# Patient Record
Sex: Female | Born: 1975 | Race: White | Hispanic: No | Marital: Married | Smoking: Never smoker
Health system: Southern US, Community
[De-identification: ages and names within clinical notes are randomized; demographics above are authoritative.]

## PROBLEM LIST (undated history)

## (undated) DIAGNOSIS — R1011 Right upper quadrant pain: Secondary | ICD-10-CM

## (undated) HISTORY — PX: OTHER SURGICAL HISTORY: SHX169

## (undated) HISTORY — PX: CHOLECYSTECTOMY: SHX55

## (undated) HISTORY — DX: Right upper quadrant pain: R10.11

---

## 1999-12-17 ENCOUNTER — Other Ambulatory Visit: Admission: RE | Admit: 1999-12-17 | Discharge: 1999-12-17 | Payer: Self-pay | Admitting: Obstetrics and Gynecology

## 2000-08-05 ENCOUNTER — Inpatient Hospital Stay (HOSPITAL_COMMUNITY): Admission: AD | Admit: 2000-08-05 | Discharge: 2000-08-08 | Payer: Self-pay | Admitting: Obstetrics and Gynecology

## 2001-04-19 ENCOUNTER — Other Ambulatory Visit: Admission: RE | Admit: 2001-04-19 | Discharge: 2001-04-19 | Payer: Self-pay | Admitting: Obstetrics and Gynecology

## 2002-04-11 ENCOUNTER — Other Ambulatory Visit: Admission: RE | Admit: 2002-04-11 | Discharge: 2002-04-11 | Payer: Self-pay | Admitting: Gynecology

## 2002-04-11 ENCOUNTER — Other Ambulatory Visit: Admission: RE | Admit: 2002-04-11 | Discharge: 2002-04-11 | Payer: Self-pay | Admitting: Obstetrics and Gynecology

## 2002-12-06 ENCOUNTER — Inpatient Hospital Stay (HOSPITAL_COMMUNITY): Admission: RE | Admit: 2002-12-06 | Discharge: 2002-12-08 | Payer: Self-pay | Admitting: Obstetrics and Gynecology

## 2003-01-21 ENCOUNTER — Other Ambulatory Visit: Admission: RE | Admit: 2003-01-21 | Discharge: 2003-01-21 | Payer: Self-pay | Admitting: Obstetrics and Gynecology

## 2004-07-15 ENCOUNTER — Other Ambulatory Visit: Admission: RE | Admit: 2004-07-15 | Discharge: 2004-07-15 | Payer: Self-pay | Admitting: Obstetrics and Gynecology

## 2006-03-25 ENCOUNTER — Emergency Department (HOSPITAL_COMMUNITY): Admission: EM | Admit: 2006-03-25 | Discharge: 2006-03-26 | Payer: Self-pay | Admitting: Emergency Medicine

## 2010-01-03 ENCOUNTER — Emergency Department (HOSPITAL_COMMUNITY)
Admission: EM | Admit: 2010-01-03 | Discharge: 2010-01-03 | Payer: Self-pay | Source: Home / Self Care | Admitting: Emergency Medicine

## 2010-04-12 LAB — URINALYSIS, ROUTINE W REFLEX MICROSCOPIC
Glucose, UA: NEGATIVE mg/dL
Hgb urine dipstick: NEGATIVE
Ketones, ur: NEGATIVE mg/dL
pH: 6 (ref 5.0–8.0)

## 2010-04-12 LAB — COMPREHENSIVE METABOLIC PANEL
ALT: 20 U/L (ref 0–35)
AST: 20 U/L (ref 0–37)
Albumin: 3.6 g/dL (ref 3.5–5.2)
CO2: 20 mEq/L (ref 19–32)
Chloride: 98 mEq/L (ref 96–112)
Creatinine, Ser: 0.89 mg/dL (ref 0.4–1.2)
GFR calc Af Amer: >60 mL/min (ref >60)
Potassium: 3.3 mEq/L — ABNORMAL LOW (ref 3.5–5.1)
Sodium: 132 mEq/L — ABNORMAL LOW (ref 135–145)
Total Bilirubin: 0.3 mg/dL (ref 0.3–1.2)

## 2010-04-12 LAB — POCT CARDIAC MARKERS
Myoglobin, poc: 141 ng/mL (ref 12–200)
Troponin i, poc: <0.05 ng/mL (ref 0.00–0.09)

## 2010-04-12 LAB — CBC
Hemoglobin: 14.6 g/dL (ref 12.0–15.0)
MCH: 30.5 pg (ref 26.0–34.0)
Platelets: 232 10*3/uL (ref 150–400)
RBC: 4.79 MIL/uL (ref 3.87–5.11)
WBC: 10.3 10*3/uL (ref 4.0–10.5)

## 2010-04-12 LAB — URINE MICROSCOPIC-ADD ON

## 2010-04-12 LAB — URINE CULTURE

## 2010-06-18 NOTE — Op Note (Signed)
Jenny Daniel, Jenny Daniel                       ACCOUNT NO.:  [id number]   MEDICAL RECORD NO.:  [id number]                   PATIENT TYPE:  INP   LOCATION:  9121                                 FACILITY:  [facility]   PHYSICIAN:  Miguel Aschoff, M.D.                    DATE OF BIRTH:  [birth year]   DATE OF PROCEDURE:  12/06/2002  DATE OF DISCHARGE:                                 OPERATIVE REPORT   PREOPERATIVE DIAGNOSIS:  Intrauterine pregnancy at 38 weeks, for elective  repeat cesarean section.   POSTOPERATIVE DIAGNOSIS:  Intrauterine pregnancy at 38 weeks, for elective  repeat cesarean section, with delivery of a viable female infant, Apgar 9 and  9.   PROCEDURE:  Repeat low flap transverse cesarean section.   SURGEON:  Miguel Aschoff, M.D.   ASSISTANT:  Carrington Clamp, M.D.   ANESTHESIA:  Spinal.   COMPLICATIONS:  None.   JUSTIFICATION:  The patient is a 35 year old white female, gravida 2, para 1-  0-0-1, status post prior cesarean section in 2002.  The patient has had an  essentially uncomplicated antepartum course and is requesting that an  elective repeat cesarean section be performed at term.  She has declined  VBAC.  She has given her informed consent and is now being taken to the  operating room for repeat cesarean section.   DESCRIPTION OF PROCEDURE:  The patient was taken to the operating room and  placed in the sitting position, and spinal anesthesia was administered  without difficulty.  She was then placed in the supine position deviated to  the left and prepped and draped in the usual sterile fashion.  A Foley  catheter was inserted.  Following this a Pfannenstiel incision was made  extending down to the subcutaneous tissue with bleeding points being clamped  and coagulated as they were encountered.  The fascia was then identified and  incised transversely.  The rectus muscles were then divided in the midline,  the peritoneum was then found and entered, carefully avoiding  underlying  structures.  The peritoneal incision was then extended under direct  visualization.  A bladder flap was then created and protected with the  bladder blade, then an elliptical transverse incision was made into the low  uterine segment.  The amniotic cavity was entered.  Light meconium-stained  fluid was obtained.  The patient was then delivered of a viable female infant,  Apgar 9 at one minute and 9 at five minutes, from a vertex LOA position.  Nose and mouth were suctioned before the baby was handed to the pediatric  team in attendance.  Cord bloods were obtained for appropriate studies.  The  placenta was then delivered, the uterine cavity evacuated of any remaining  products of conception.  At this point the angles of the uterine incision  were identified and ligated using figure-of-eight sutures of #1 Vicryl.  Then the uterus was closed  in layers.  The first layer was a running  interlocking suture of #1 Vicryl, followed by an imbricating suture of #1  Vicryl.  The suture line was inspected.  There were two areas of oozing  noted, and these were promptly brought under control by figure-of-eight  sutures of 0 Vicryl suture.  The bladder flap was then reapproximated using  running continuous 2-0 Vicryl suture.  At this point lap counts and  instrument counts were taken and found to be correct.  Tubes and ovaries  were inspected to be normal, then the abdomen was closed.  The parietal  peritoneum was closed using running continuous 0 Vicryl, the rectus muscles  were reapproximated using running continuous 0 Vicryl suture, and the fascia  was closed using two sutures of 0 Vicryl, each starting at  the lateral fascial angles and meeting in the midline.  The subcutaneous  tissue and skin were closed using staples.  The estimated blood loss was  approximately 600 mL.  The patient tolerated the procedure well and went to  the recovery room in satisfactory condition.                                                Miguel Aschoff, M.D.    AR/MEDQ  D:  12/06/2002  T:  12/06/2002  Job:  [id number]

## 2010-06-18 NOTE — Discharge Summary (Signed)
Jenny Daniel, Jenny Daniel                       ACCOUNT NO.:  [id number]   MEDICAL RECORD NO.:  [id number]                   PATIENT TYPE:  INP   LOCATION:  9121                                 FACILITY:  [facility]   PHYSICIAN:  Randye Lobo, M.D.                DATE OF BIRTH:  01-11-1976   DATE OF ADMISSION:  12/06/2002  DATE OF DISCHARGE:  12/08/2002                                 DISCHARGE SUMMARY   FINAL DIAGNOSES:  1. Intrauterine pregnancy at [redacted] weeks gestation.  2. Desires repeat cesarean section.  3. Declines vaginal birth after cesarean.   PROCEDURE:  Repeat low flap transverse cesarean section.   SURGEON:  Miguel Aschoff, M.D.   ASSISTANT:  Carrington Clamp, M.D.   COMPLICATIONS:  None.   HISTORY:  This 35 year old G2, P1-0-0-1 had a previous cesarean section in  2002 and desires a repeat cesarean section with this pregnancy and declines  VBAC.  The patient's antepartum course had been uncomplicated.  She is taken  to the operating room on December 06, 2002 by Miguel Aschoff, M.D. where a repeat  low flap transverse cesarean section was performed with the delivery of a 7  pound 14 ounce female infant with Apgars of 9 and 9.  The delivery went  without complications.  The patient's postoperative course was benign  without significant fevers.  The little boy was circumcised before  discharge.  The patient was felt ready for discharge on postoperative day  #2.  Was sent home on a regular diet.  Told to decrease activities.  Told to  continue prenatal vitamins and FeSo4 325 mg one b.i.d.  Was given Percocet  one to two q.4h. as needed for pain.  Told she could use over-the-counter  ibuprofen as needed and was to follow up in the office in two days for  staple removal.   DISCHARGE LABORATORIES:  The patient had a hemoglobin of 10.3 and a white  blood cell count of 11.2.     Leilani Able, P.A.-C.                Randye Lobo, M.D.    MB/MEDQ  D:  01/06/2003  T:  01/06/2003   Job:  [id number]

## 2010-06-18 NOTE — Discharge Summary (Signed)
[facility]  Patient:    Jenny Daniel, Jenny Daniel                    MRN: [id number] Adm. Date:  [id number] Disc. Date: [id number] Attending:  Miguel Aschoff Dictator:   Leilani Able, P.A.                           Discharge Summary  FINAL DIAGNOSES:              Intrauterine pregnancy at term, arrest of second stage of labor with failure of descent.  PROCEDURE:                    Primary low flap transverse cesarean section.  SURGEON:                      Miguel Aschoff, M.D.  COMPLICATIONS:                None.  HISTORY:                      This 35 year old G1, P0 presents at term in active labor.  Patients cervix is about 5 cm dilated.  She progressed to complete complete.  Had an epidural placed.  Patient was allowed to have a second stage of labor lasting three hours and in spite of vigorous pushing she was unable to generate any further descent of the fetal presenting part further than a +1 station.  In view of the arrest of descent patient was taken to the operating room to undergo a primary cesarean section.  Patient is taken to the operating room by Dr. Miguel Aschoff where a primary low flap transverse cesarean section was performed with the delivery of an 8 pound 9 ounce female infant with Apgars of 8 and 9.  Delivery went without complications. Patients postoperative course was benign without significant fevers.  She was felt ready for discharge on postoperative day #2.  Was sent home on a regular diet.  Told to decrease activities.  Was given prescriptions for Motrin and Tylox for pain and told to follow-up in the office in four weeks. DD:  08/24/00 TD:  08/24/00 Job: [id number]

## 2010-06-18 NOTE — Op Note (Signed)
[facility]  Patient:    Jenny Daniel, Jenny Daniel Visit Number: [id number] MRN: [id number]          Service Type: Attending:  Miguel Aschoff, M.D. Proc. Date: 08/06/00                             Operative Report  PREOPERATIVE DIAGNOSIS:       Intrauterine pregnancy at term with arrest of the second stage of labor with failure of descent.  POSTOPERATIVE DIAGNOSES:      1. Intrauterine pregnancy at term with arrest of                                  the second stage of labor with failure of                                  descent.                               2. Delivery of viable female infant, Apgars                                  8 and 9 weighing 8 lb 9 oz.  PROCEDURE:                    Primary low flap transverse cesarean section.  SURGEON:                      Miguel Aschoff, M.D.  ANESTHESIA:                   Epidural.  COMPLICATIONS:                None.  JUSTIFICATION:                The patient is a 35 year old white female, gravida 1, para 0 with an estimated date of confinement of August 06, 2000.  The patient was admitted 5 cm dilatation and progressed to complete/complete at 9:20 a.m.  The patient has had an epidural anesthetic and was allowed to have a second stage of labor lasting three hours.  In spite of vigorous pushing efforts, she has been unable to generate any further descent of the fetal presenting part further than to +1 station.  Estimated fetal weight is approximately 8  to 8-1/2 lb.  In view of the arrest of descent, she is being taken now to undergo primary cesarean section.  DESCRIPTION OF PROCEDURE:     The patient was taken to the operating room and placed in the supine position.  A satisfactory level of epidural anesthesia was achieved without difficulty.  The patient had a previously-placed Foley catheter.  She was prepped and draped in the usual sterile fashion.  A Pfannenstiel incision was then made and extended down  through the subcutaneous tissue, with bleeding points being clamped and coagulated as they were encountered.  The fascia was then identified and incised transversely and separated from the underlying rectus muscles.  The rectus muscles were divided in the midline.  The peritoneum was then identified and entered carefully, avoiding underlying structures.  The peritoneal incision was  then extended under direct visualization.  A bladder flap was then created and protected with a bladder blade.  An elliptical transverse incision was made in the lower uterine segment.  The amniotic cavity was entered.  Light meconium-stained fluid was noted.  The baby was delivered from a vertex ROA position.  The nose and mouth were suctioned with DeLee suction.  The cord was doubly clamped and cut and the baby was handed  to the pediatric team in attendance.  The Apgar score at one minute was 8 and, at five minutes, was 9.  The babys weight was 8 lb 9 oz.  Cord bloods were then obtained for appropriate studies.  The placenta was delivered.  The uterus was evacuated of any remaining products of conception.  The angles of the uterine incision were then ligated using figure-of-eight sutures of #1 Vicryl.  The uterus was then closed in layers. The first layer was a running interlocking suture of #1 Vicryl followed by an imbricating suture of #1 Vicryl.  The bladder flap was reapproximated using running continuous 2-0 Vicryl suture.  The tubes and ovaries were inspected and were noted to be within normal limits.  The abdomen was irrigated with warm saline.  Lap counts were taken and found to be correct.  The abdomen was then closed.  The parietal peritoneum was closed using running continuous 0 Vicryl suture.  The fascia was reapproximated using two sutures of 0 Vicryl, each starting at the lateral fascial angles and meeting in the midline.  The subcutaneous tissue and skin were closed using staples.  Estimated  blood loss was approximately 800 cc.  The patient tolerated the procedure well and went to the recovery room in satisfactory condition. Attending:  Miguel Aschoff, M.D. DD:  08/06/00 TD:  08/06/00 Job: [id number]

## 2019-03-19 ENCOUNTER — Other Ambulatory Visit: Payer: Self-pay | Admitting: Obstetrics

## 2019-03-19 DIAGNOSIS — R928 Other abnormal and inconclusive findings on diagnostic imaging of breast: Secondary | ICD-10-CM

## 2019-03-22 ENCOUNTER — Other Ambulatory Visit: Payer: Self-pay

## 2019-03-22 ENCOUNTER — Other Ambulatory Visit: Payer: Self-pay | Admitting: Obstetrics

## 2019-03-22 ENCOUNTER — Ambulatory Visit
Admission: RE | Admit: 2019-03-22 | Discharge: 2019-03-22 | Disposition: A | Discharge status: Home / Self Care | Payer: BC Managed Care – PPO | Source: Ambulatory Visit | Attending: Obstetrics | Admitting: Obstetrics

## 2019-03-22 DIAGNOSIS — R928 Other abnormal and inconclusive findings on diagnostic imaging of breast: Secondary | ICD-10-CM

## 2019-03-22 DIAGNOSIS — N6320 Unspecified lump in the left breast, unspecified quadrant: Secondary | ICD-10-CM

## 2019-08-12 ENCOUNTER — Encounter: Payer: Self-pay | Admitting: Physician Assistant

## 2019-08-12 ENCOUNTER — Other Ambulatory Visit: Payer: Self-pay

## 2019-08-12 ENCOUNTER — Ambulatory Visit: Payer: BC Managed Care – PPO | Admitting: Physician Assistant

## 2019-08-12 VITALS — BP 110/68 | HR 88 | Temp 97.9°F | Ht 61.0 in | Wt 171.0 lb

## 2019-08-12 DIAGNOSIS — F419 Anxiety disorder, unspecified: Secondary | ICD-10-CM | POA: Insufficient documentation

## 2019-08-12 DIAGNOSIS — R1011 Right upper quadrant pain: Secondary | ICD-10-CM | POA: Diagnosis not present

## 2019-08-12 MED ORDER — PANTOPRAZOLE SODIUM 40 MG PO TBEC: 40.0000 mg | DELAYED_RELEASE_TABLET | Freq: Every day | ORAL | 1 refills | Status: DC

## 2019-08-12 MED ORDER — LORAZEPAM 0.5 MG PO TABS: 0.5000 mg | ORAL_TABLET | Freq: Every day | ORAL | 0 refills | Status: DC | PRN

## 2019-08-12 NOTE — Assessment & Plan Note (Signed)
rx for ativan 

## 2019-08-12 NOTE — Progress Notes (Signed)
Established Patient Office Visit  Subjective:  Patient ID: Jenny Daniel, female    DOB: [birth year]  Age: 44 y.o. MRN: [id number]  CC:  Chief Complaint  Patient presents with  . Abdominal Pain    Right upper quadrant    HPI Jenny Daniel presents for RUQ pain Pt has had issues intermittently for several months with ruq pain, indigestion and nausea - she has not had vomiting Had a 'spell' last week - episodes happen intermittently Had been advised in the past to have gallbladder evaluated but she has put this off  Pt states that she has been having some intermittent anxiety attacks - has been a problem off and on for awhile - has never been on medication States she gets nervous and anxious - feels like she can't get good deep breath  Past Medical History:  Diagnosis Date  . Right upper quadrant pain     Past Surgical History:  Procedure Laterality Date  . CESAREAN SECTION     x2  . pilonidal cyst removal      Family History  Problem Relation Age of Onset  . Diabetes Mellitus II Other   . Hyperlipidemia Other   . Hypertension Other     Social History   Socioeconomic History  . Marital status: Married    Spouse name: Not on file  . Number of children: 2  . Years of education: Not on file  . Highest education level: Not on file  Occupational History  . 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 Smoking status: Never Smoker  . Smokeless tobacco: Never Used  Vaping Use  . Vaping Use: Never used  Substance and Sexual Activity  . Alcohol use: Not Currently  . Drug use: Never  . Sexual activity: Not on file  Other Topics Concern  . Not on file  Social History Narrative  . Not on file   Social Determinants of Health   Financial Resource Strain:   . Difficulty of Paying Living Expenses:   Food Insecurity:   . Worried About Programme researcher, broadcasting/film/video in the Last Year:   . Barista in the Last Year:   Transportation Needs:   . Automotive engineer (Medical):   Marland Kitchen Lack of Transportation (Non-Medical):   Physical Activity:   . Days of Exercise per Week:   . Minutes of Exercise per Session:   Stress:   . Feeling of Stress :   Social Connections:   . Frequency of Communication with Friends and Family:   . Frequency of Social Gatherings with Friends and Family:   . Attends Religious Services:   . Active Member of Clubs or Organizations:   . Attends Banker Meetings:   Marland Kitchen Marital Status:   Intimate Partner Violence:   . Fear of Current or Ex-Partner:   . Emotionally Abused:   Marland Kitchen Physically Abused:   . Sexually Abused:      Current Outpatient Medications:  .  LORazepam (ATIVAN) 0.5 MG tablet, Take 1 tablet (0.5 mg total) by mouth daily as needed for anxiety., Disp: 30 tablet, Rfl: 0 .  pantoprazole (PROTONIX) 40 MG tablet, Take 1 tablet (40 mg total) by mouth daily., Disp: 30 tablet, Rfl: 1   No Known Allergies  ROS CONSTITUTIONAL: Negative for chills, fatigue, fever, unintentional weight gain and unintentional weight loss.   CARDIOVASCULAR: Negative for chest pain, dizziness, palpitations and pedal edema.  RESPIRATORY: Negative for recent cough and dyspnea.  GASTROINTESTINAL:see HPI  PSYCHIATRIC: see HPI       Objective:    PHYSICAL EXAM:   VS: BP 110/68 (BP Location: Left Arm, Patient Position: Sitting)   Pulse 88   Temp 97.9 F (36.6 C) (Temporal)   Ht 5\' 1"  (1.549 m)   Wt 171 lb (77.6 kg)   SpO2 100%   BMI 32.31 kg/m   GEN: Well nourished, well developed, in no acute distress  Cardiac: RRR; no murmurs, rubs, or gallops,no edema - no significant varicosities Respiratory:  normal respiratory rate and pattern with no distress - normal breath sounds with no rales, rhonchi, wheezes or rubs  Psych: euthymic mood, appropriate affect and demeanor  BP 110/68 (BP Location: Left Arm, Patient Position: Sitting)   Pulse 88   Temp 97.9 F (36.6 C) (Temporal)   Ht 5\' 1"  (1.549 m)   Wt 171  lb (77.6 kg)   SpO2 100%   BMI 32.31 kg/m  Wt Readings from Last 3 Encounters:  08/12/19 171 lb (77.6 kg)     Health Maintenance Due  Topic Date Due  . Hepatitis C Screening  Never done  . COVID-19 Vaccine (1) Never done  . HIV Screening  Never done  . PAP SMEAR-Modifier  Never done    There are no preventive care reminders to display for this patient.  No results found for: TSH Lab Results  Component Value Date   WBC 10.3 01/03/2010   HGB 14.6 01/03/2010   HCT 42.7 01/03/2010   MCV 89.1 01/03/2010   PLT 232 01/03/2010   Lab Results  Component Value Date   NA 132 (L) 01/03/2010   K 3.3 (L) 01/03/2010   CO2 20 01/03/2010   GLUCOSE 83 01/03/2010   BUN 9 01/03/2010   CREATININE 0.89 01/03/2010   BILITOT 0.3 01/03/2010   ALKPHOS 47 01/03/2010   AST 20 01/03/2010   ALT 20 01/03/2010   PROT 7.7 01/03/2010   ALBUMIN 3.6 01/03/2010   CALCIUM 9.0 01/03/2010   No results found for: CHOL No results found for: HDL No results found for: LDLCALC No results found for: TRIG No results found for: CHOLHDL No results found for: 14/05/2009    Assessment & Plan:   Problem List Items Addressed This Visit      Other   Right upper quadrant abdominal pain - Primary    Gallbladder diet rx for protonix labwork pending Will schedule gallbladder ultrasound      Relevant Orders   Comprehensive metabolic panel   Helicobacter pylori abs-IgG+IgA, bld   14/05/2009 Abdomen Limited RUQ   Anxiety    rx for ativan      Relevant Medications   LORazepam (ATIVAN) 0.5 MG tablet      Meds ordered this encounter  Medications  . pantoprazole (PROTONIX) 40 MG tablet    Sig: Take 1 tablet (40 mg total) by mouth daily.    Dispense:  30 tablet    Refill:  1    Order Specific Question:   Supervising Provider    [email or web address] [place]  . LORazepam (ATIVAN) 0.5 MG tablet    Sig: Take 1 tablet (0.5 mg total) by mouth daily as needed for anxiety.    Dispense:  30 tablet    Refill:  0     Order Specific Question:   Supervising Provider    Answer:   Blane Ohara    Follow-up: Return if symptoms worsen or fail to improve.  SARA R Kolsen Choe, PA-C

## 2019-08-12 NOTE — Patient Instructions (Signed)
Gallbladder Eating Plan If you have a gallbladder condition, you may have trouble digesting fats. Eating a low-fat diet can help reduce your symptoms, and may be helpful before and after having surgery to remove your gallbladder (cholecystectomy). Your health care provider may recommend that you work with a diet and nutrition specialist (dietitian) to help you reduce the amount of fat in your diet. What are tips for following this plan? General guidelines  Limit your fat intake to less than 30% of your total daily calories. If you eat around 1,800 calories each day, this is less than 60 grams (g) of fat per day.  Fat is an important part of a healthy diet. Eating a low-fat diet can make it hard to maintain a healthy body weight. Ask your dietitian how much fat, calories, and other nutrients you need each day.  Eat small, frequent meals throughout the day instead of three large meals.  Drink at least 8-10 cups of fluid a day. Drink enough fluid to keep your urine clear or pale yellow.  Limit alcohol intake to no more than 1 drink a day for nonpregnant women and 2 drinks a day for men. One drink equals 12 oz of beer, 5 oz of wine, or 1 oz of hard liquor. Reading food labels  Check Nutrition Facts on food labels for the amount of fat per serving. Choose foods with less than 3 grams of fat per serving. Shopping  Choose nonfat and low-fat healthy foods. Look for the words "nonfat," "low fat," or "fat free."  Avoid buying processed or prepackaged foods. Cooking  Cook using low-fat methods, such as baking, broiling, grilling, or boiling.  Cook with small amounts of healthy fats, such as olive oil, grapeseed oil, canola oil, or sunflower oil. What foods are recommended?   All fresh, frozen, or canned fruits and vegetables.  Whole grains.  Low-fat or non-fat (skim) milk and yogurt.  Lean meat, skinless poultry, fish, eggs, and beans.  Low-fat protein supplement powders or  drinks.  Spices and herbs. What foods are not recommended?  High-fat foods. These include baked goods, fast food, fatty cuts of meat, ice cream, french toast, sweet rolls, pizza, cheese bread, foods covered with butter, creamy sauces, or cheese.  Fried foods. These include french fries, tempura, battered fish, breaded chicken, fried breads, and sweets.  Foods with strong odors.  Foods that cause bloating and gas. Summary  A low-fat diet can be helpful if you have a gallbladder condition, or before and after gallbladder surgery.  Limit your fat intake to less than 30% of your total daily calories. This is about 60 g of fat if you eat 1,800 calories each day.  Eat small, frequent meals throughout the day instead of three large meals. This information is not intended to replace advice given to you by your health care provider. Make sure you discuss any questions you have with your health care provider. Document Revised: 05/10/2018 Document Reviewed: 02/25/2016 Elsevier Patient Education  2020 [employer] Inc.  

## 2019-08-12 NOTE — Assessment & Plan Note (Signed)
Gallbladder diet rx for protonix labwork pending Will schedule gallbladder ultrasound

## 2019-08-13 LAB — COMPREHENSIVE METABOLIC PANEL
ALT: 20 IU/L (ref 0–32)
AST: 20 IU/L (ref 0–40)
Albumin/Globulin Ratio: 1.5 (ref 1.2–2.2)
Albumin: 4.6 g/dL (ref 3.8–4.8)
Alkaline Phosphatase: 61 IU/L (ref 48–121)
BUN/Creatinine Ratio: 15 (ref 9–23)
BUN: 12 mg/dL (ref 6–24)
Bilirubin Total: 0.3 mg/dL (ref 0.0–1.2)
CO2: 21 mmol/L (ref 20–29)
Calcium: 9.4 mg/dL (ref 8.7–10.2)
Chloride: 107 mmol/L — ABNORMAL HIGH (ref 96–106)
Creatinine, Ser: 0.8 mg/dL (ref 0.57–1.00)
GFR calc Af Amer: 104 mL/min/{1.73_m2} (ref >59)
GFR calc non Af Amer: 91 mL/min/{1.73_m2} (ref >59)
Globulin, Total: 3 g/dL (ref 1.5–4.5)
Glucose: 88 mg/dL (ref 65–99)
Potassium: 4.2 mmol/L (ref 3.5–5.2)
Sodium: 143 mmol/L (ref 134–144)
Total Protein: 7.6 g/dL (ref 6.0–8.5)

## 2019-08-13 LAB — HELICOBACTER PYLORI ABS-IGG+IGA, BLD
H. pylori, IgA Abs: 16.3 units — ABNORMAL HIGH (ref 0.0–8.9)
H. pylori, IgG AbS: 0.21 Index Value (ref 0.00–0.79)

## 2019-08-14 ENCOUNTER — Other Ambulatory Visit: Payer: Self-pay | Admitting: Physician Assistant

## 2019-08-14 MED ORDER — CLARITHROMYCIN 500 MG PO TABS: 500.0000 mg | ORAL_TABLET | Freq: Two times a day (BID) | ORAL | 0 refills | Status: DC

## 2019-08-14 MED ORDER — AMOXICILLIN 500 MG PO CAPS: 1000.0000 mg | ORAL_CAPSULE | Freq: Two times a day (BID) | ORAL | 0 refills | Status: DC

## 2019-08-27 ENCOUNTER — Encounter: Payer: Self-pay | Admitting: Physician Assistant

## 2019-08-28 ENCOUNTER — Other Ambulatory Visit: Payer: Self-pay | Admitting: Physician Assistant

## 2019-08-28 DIAGNOSIS — K769 Liver disease, unspecified: Secondary | ICD-10-CM

## 2019-09-03 ENCOUNTER — Other Ambulatory Visit: Payer: Self-pay | Admitting: Physician Assistant

## 2019-09-05 ENCOUNTER — Other Ambulatory Visit: Payer: Self-pay

## 2019-09-05 ENCOUNTER — Ambulatory Visit: Payer: BC Managed Care – PPO | Admitting: Physician Assistant

## 2019-09-05 ENCOUNTER — Encounter: Payer: Self-pay | Admitting: Physician Assistant

## 2019-09-05 VITALS — BP 94/58 | HR 84 | Temp 97.2°F | Ht 61.0 in | Wt 168.0 lb

## 2019-09-05 DIAGNOSIS — R1084 Generalized abdominal pain: Secondary | ICD-10-CM | POA: Diagnosis not present

## 2019-09-05 DIAGNOSIS — R5383 Other fatigue: Secondary | ICD-10-CM | POA: Diagnosis not present

## 2019-09-05 NOTE — Assessment & Plan Note (Signed)
labwork pending 

## 2019-09-05 NOTE — Assessment & Plan Note (Signed)
Pt requested gluten allergy testing Continue protonix as directed Pt being scheduled for MRI liver (abnormal ultrasound) Follow up in 2 months

## 2019-09-05 NOTE — Progress Notes (Signed)
Established Patient Office Visit  Subjective:  Patient ID: Jenny Daniel, female    DOB: 1975/05/08  Age: 44 y.o. MRN: [id number]  CC:  Chief Complaint  Patient presents with  . Follow up hpylori    HPI Jenny Daniel presents for follow up of hpylori Pt states she is feeling overall better after completing antibiotic therapy - still having some slight burning in stomach - she denies nausea/vomiting Is still taking the protonix as directed However she states she is feeling fatigued She also requested to have labs drawn for gluten intolerance  Pt states that she has been having some intermittent anxiety attacks - she has taken a few of the ativan which do help  Past Medical History:  Diagnosis Date  . Right upper quadrant pain     Past Surgical History:  Procedure Laterality Date  . CESAREAN SECTION     x2  . pilonidal cyst removal      Family History  Problem Relation Age of Onset  . Diabetes Mellitus II Other   . Hyperlipidemia Other   . Hypertension Other     Social History   Socioeconomic History  . Marital status: Married    Spouse name: Not on file  . Number of children: 2  . Years of education: Not on file  . Highest education level: Not on file  Occupational History  . 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 Smoking status: Never Smoker  . Smokeless tobacco: Never Used  Vaping Use  . Vaping Use: Never used  Substance and Sexual Activity  . Alcohol use: Not Currently  . Drug use: Never  . Sexual activity: Not on file  Other Topics Concern  . Not on file  Social History Narrative  . Not on file   Social Determinants of Health   Financial Resource Strain:   . Difficulty of Paying Living Expenses:   Food Insecurity:   . Worried About Programme researcher, broadcasting/film/video in the Last Year:   . Barista in the Last Year:   Transportation Needs:   . Freight forwarder (Medical):   Marland Kitchen Lack of Transportation (Non-Medical):   Physical  Activity:   . Days of Exercise per Week:   . Minutes of Exercise per Session:   Stress:   . Feeling of Stress :   Social Connections:   . Frequency of Communication with Friends and Family:   . Frequency of Social Gatherings with Friends and Family:   . Attends Religious Services:   . Active Member of Clubs or Organizations:   . Attends Banker Meetings:   Marland Kitchen Marital Status:   Intimate Partner Violence:   . Fear of Current or Ex-Partner:   . Emotionally Abused:   Marland Kitchen Physically Abused:   . Sexually Abused:      Current Outpatient Medications:  .  LORazepam (ATIVAN) 0.5 MG tablet, Take 1 tablet (0.5 mg total) by mouth daily as needed for anxiety., Disp: 30 tablet, Rfl: 0 .  pantoprazole (PROTONIX) 40 MG tablet, TAKE 1 TABLET BY MOUTH EVERY DAY, Disp: 30 tablet, Rfl: 1   No Known Allergies  ROS CONSTITUTIONAL: Negative for chills, fatigue, fever, unintentional weight gain and unintentional weight loss.   CARDIOVASCULAR: Negative for chest pain, dizziness, palpitations and pedal edema.  RESPIRATORY: Negative for recent cough and dyspnea.  GASTROINTESTINAL:see HPI  PSYCHIATRIC: see HPI       Objective:    PHYSICAL EXAM:   VS:  BP (!) 94/58 (BP Location: Left Arm, Patient Position: Sitting)   Pulse 84   Temp (!) 97.2 F (36.2 C) (Temporal)   Ht 5\' 1"  (1.549 m)   Wt 168 lb (76.2 kg)   SpO2 99%   BMI 31.74 kg/m   GEN: Well nourished, well developed, in no acute distress  Cardiac: RRR; no murmurs, rubs, or gallops,no edema - no significant varicosities Respiratory:  normal respiratory rate and pattern with no distress - normal breath sounds with no rales, rhonchi, wheezes or rubs  Psych: euthymic mood, appropriate affect and demeanor  BP (!) 94/58 (BP Location: Left Arm, Patient Position: Sitting)   Pulse 84   Temp (!) 97.2 F (36.2 C) (Temporal)   Ht 5\' 1"  (1.549 m)   Wt 168 lb (76.2 kg)   SpO2 99%   BMI 31.74 kg/m  Wt Readings from Last 3  Encounters:  09/05/19 168 lb (76.2 kg)  08/12/19 171 lb (77.6 kg)     Health Maintenance Due  Topic Date Due  . Hepatitis C Screening  Never done  . COVID-19 Vaccine (1) Never done  . HIV Screening  Never done  . PAP SMEAR-Modifier  Never done  . INFLUENZA VACCINE  09/01/2019    There are no preventive care reminders to display for this patient.  No results found for: TSH Lab Results  Component Value Date   WBC 10.3 01/03/2010   HGB 14.6 01/03/2010   HCT 42.7 01/03/2010   MCV 89.1 01/03/2010   PLT 232 01/03/2010   Lab Results  Component Value Date   NA 143 08/12/2019   K 4.2 08/12/2019   CO2 21 08/12/2019   GLUCOSE 88 08/12/2019   BUN 12 08/12/2019   CREATININE 0.80 08/12/2019   BILITOT 0.3 08/12/2019   ALKPHOS 61 08/12/2019   AST 20 08/12/2019   ALT 20 08/12/2019   PROT 7.6 08/12/2019   ALBUMIN 4.6 08/12/2019   CALCIUM 9.4 08/12/2019   No results found for: CHOL No results found for: HDL No results found for: LDLCALC No results found for: TRIG No results found for: CHOLHDL No results found for: 10/13/2019    Assessment & Plan:   Problem List Items Addressed This Visit      Other   Generalized abdominal pain - Primary    Pt requested gluten allergy testing Continue protonix as directed Pt being scheduled for MRI liver (abnormal ultrasound) Follow up in 2 months       Relevant Orders   Gluten Sensitivity Screen   Other fatigue    labwork pending      Relevant Orders   CBC with Differential/Platelet   Comprehensive metabolic panel   TSH      No orders of the defined types were placed in this encounter.   Follow-up: Return in about 2 months (around 11/05/2019) for follow up.    SARA R Samia Kukla, PA-C

## 2019-09-09 ENCOUNTER — Telehealth (INDEPENDENT_AMBULATORY_CARE_PROVIDER_SITE_OTHER): Payer: BC Managed Care – PPO | Admitting: Legal Medicine

## 2019-09-09 ENCOUNTER — Encounter: Payer: Self-pay | Admitting: Legal Medicine

## 2019-09-09 DIAGNOSIS — I2699 Other pulmonary embolism without acute cor pulmonale: Secondary | ICD-10-CM | POA: Diagnosis not present

## 2019-09-09 NOTE — Progress Notes (Signed)
Virtual Visit via Telephone Note   This visit type was conducted due to national recommendations for restrictions regarding the COVID-19 Pandemic (e.g. social distancing) in an effort to limit this patient's exposure and mitigate transmission in our community.  Due to her co-morbid illnesses, this patient is at least at moderate risk for complications without adequate follow up.  This format is felt to be most appropriate for this patient at this time.  The patient did not have access to video technology/had technical difficulties with video requiring transitioning to audio format only (telephone).  All issues noted in this document were discussed and addressed.  No physical exam could be performed with this format.  Patient verbally consented to a telehealth visit.   Date:  09/09/2019   ID:  Jenny Daniel, DOB [birth year], MRN [id number]  Patient Location: Home Provider Location: Office/Clinic  PCP:  Marianne Sofia, PA-C   Evaluation Performed:  New Patient Evaluation  Chief Complaint:  Cough, dyspnea  History of Present Illness:    Jenny Daniel is a 44 y.o. female with sudden onset cough and dyspnea.  Weak. Pulse 107.  She is concerned about PE because hypercoagulability runs in family.  No past work up.  No calf pain or swelling.  The patient does not have symptoms concerning for COVID-19 infection (fever, chills, cough, or new shortness of breath).    Past Medical History:  Diagnosis Date  . Right upper quadrant pain     Past Surgical History:  Procedure Laterality Date  . CESAREAN SECTION     x2  . pilonidal cyst removal      Family History  Problem Relation Age of Onset  . Diabetes Mellitus II Other   . Hyperlipidemia Other   . Hypertension Other     Social History   Socioeconomic History  . Marital status: Married    Spouse name: Not on file  . Number of children: 2  . Years of education: Not on file  . Highest education level: Not on file  Occupational  History  . 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 Smoking status: Never Smoker  . Smokeless tobacco: Never Used  Vaping Use  . Vaping Use: Never used  Substance and Sexual Activity  . Alcohol use: Not Currently  . Drug use: Never  . Sexual activity: Not on file  Other Topics Concern  . Not on file  Social History Narrative  . Not on file   Social Determinants of Health   Financial Resource Strain:   . Difficulty of Paying Living Expenses:   Food Insecurity:   . Worried About Programme researcher, broadcasting/film/video in the Last Year:   . Barista in the Last Year:   Transportation Needs:   . Freight forwarder (Medical):   Marland Kitchen Lack of Transportation (Non-Medical):   Physical Activity:   . Days of Exercise per Week:   . Minutes of Exercise per Session:   Stress:   . Feeling of Stress :   Social Connections:   . Frequency of Communication with Friends and Family:   . Frequency of Social Gatherings with Friends and Family:   . Attends Religious Services:   . Active Member of Clubs or Organizations:   . Attends Banker Meetings:   Marland Kitchen Marital Status:   Intimate Partner Violence:   . Fear of Current or Ex-Partner:   . Emotionally Abused:   Marland Kitchen Physically Abused:   . Sexually Abused:  Outpatient Medications Prior to Visit  Medication Sig Dispense Refill  . LORazepam (ATIVAN) 0.5 MG tablet Take 1 tablet (0.5 mg total) by mouth daily as needed for anxiety. 30 tablet 0  . pantoprazole (PROTONIX) 40 MG tablet TAKE 1 TABLET BY MOUTH EVERY DAY 30 tablet 1   No facility-administered medications prior to visit.   .med Allergies:   Patient has no known allergies.   Social History   Tobacco Use  . Smoking status: Never Smoker  . Smokeless tobacco: Never Used  Vaping Use  . Vaping Use: Never used  Substance Use Topics  . Alcohol use: Not Currently  . Drug use: Never     Review of Systems  Constitutional: Negative.   Eyes: Negative.   Respiratory: Positive for  cough and shortness of breath.   Cardiovascular: Negative.   Gastrointestinal: Negative.   Genitourinary: Negative.   Musculoskeletal: Negative.   Skin: Negative.   Neurological: Negative.   Psychiatric/Behavioral: Negative.      Labs/Other Tests and Data Reviewed:    Recent Labs: 08/12/2019: ALT 20; BUN 12; Creatinine, Ser 0.80; Potassium 4.2; Sodium 143   Recent Lipid Panel No results found for: CHOL, TRIG, HDL, CHOLHDL, LDLCALC, LDLDIRECT  Wt Readings from Last 3 Encounters:  09/09/19 160 lb (72.6 kg)  09/05/19 168 lb (76.2 kg)  08/12/19 171 lb (77.6 kg)     Objective:    Vital Signs:  BP 110/70   Pulse (!) 107   Temp 98.2 F (36.8 C)   Ht 5\' 1"  (1.549 m)   Wt 160 lb (72.6 kg)   BMI 30.23 kg/m    Physical Exam Reviewed  ASSESSMENT & PLAN:    Diagnoses and all orders for this visit: Acute pulmonary embolism, unspecified pulmonary embolism type, unspecified whether acute cor pulmonale present (HCC) ct angiogram was negative for pulmonary embolus, patient informed No orders of the defined types were placed in this encounter.    No orders of the defined types were placed in this encounter.   COVID-19 Education: The signs and symptoms of COVID-19 were discussed with the patient and how to seek care for testing (follow up with PCP or arrange E-visit). The importance of social distancing was discussed today.  Time:   Today, I have spent 20 minutes with the patient with telehealth technology discussing the above problems.    Follow Up:  In Person prn  Signed, , MD  09/09/2019 6:10 PM    [facility] [place]

## 2019-09-10 ENCOUNTER — Encounter: Payer: Self-pay | Admitting: Legal Medicine

## 2019-09-11 LAB — COMPREHENSIVE METABOLIC PANEL
ALT: 23 IU/L (ref 0–32)
AST: 18 IU/L (ref 0–40)
Albumin/Globulin Ratio: 1.5 (ref 1.2–2.2)
Albumin: 4.4 g/dL (ref 3.8–4.8)
Alkaline Phosphatase: 73 IU/L (ref 48–121)
BUN/Creatinine Ratio: 9 (ref 9–23)
BUN: 8 mg/dL (ref 6–24)
Bilirubin Total: 0.2 mg/dL (ref 0.0–1.2)
CO2: 22 mmol/L (ref 20–29)
Calcium: 9.6 mg/dL (ref 8.7–10.2)
Chloride: 106 mmol/L (ref 96–106)
Creatinine, Ser: 0.86 mg/dL (ref 0.57–1.00)
GFR calc Af Amer: 96 mL/min/{1.73_m2} (ref >59)
GFR calc non Af Amer: 83 mL/min/{1.73_m2} (ref >59)
Globulin, Total: 2.9 g/dL (ref 1.5–4.5)
Glucose: 93 mg/dL (ref 65–99)
Potassium: 4.3 mmol/L (ref 3.5–5.2)
Sodium: 142 mmol/L (ref 134–144)
Total Protein: 7.3 g/dL (ref 6.0–8.5)

## 2019-09-11 LAB — CBC WITH DIFFERENTIAL/PLATELET
Basophils Absolute: 0.1 10*3/uL (ref 0.0–0.2)
Basos: 1 %
EOS (ABSOLUTE): 0.1 10*3/uL (ref 0.0–0.4)
Eos: 1 %
Hematocrit: 42.7 % (ref 34.0–46.6)
Hemoglobin: 14.7 g/dL (ref 11.1–15.9)
Immature Grans (Abs): 0 10*3/uL (ref 0.0–0.1)
Immature Granulocytes: 0 %
Lymphocytes Absolute: 1.2 10*3/uL (ref 0.7–3.1)
Lymphs: 18 %
MCH: 29.8 pg (ref 26.6–33.0)
MCHC: 34.4 g/dL (ref 31.5–35.7)
MCV: 87 fL (ref 79–97)
Monocytes Absolute: 0.6 10*3/uL (ref 0.1–0.9)
Monocytes: 10 %
Neutrophils Absolute: 4.7 10*3/uL (ref 1.4–7.0)
Neutrophils: 70 %
Platelets: 281 10*3/uL (ref 150–450)
RBC: 4.93 x10E6/uL (ref 3.77–5.28)
RDW: 12.3 % (ref 11.7–15.4)
WBC: 6.6 10*3/uL (ref 3.4–10.8)

## 2019-09-11 LAB — F004-IGE WHEAT: Wheat IgE: <0.1 kU/L

## 2019-09-11 LAB — GLUTEN SENSITIVITY SCREEN: tTG/DGP Screen: NEGATIVE

## 2019-09-11 LAB — NOTE:

## 2019-09-11 LAB — ANTIGLIADIN IGG (NATIVE): Antigliadin IgG (native): 9 units (ref 0–19)

## 2019-09-11 LAB — TSH: TSH: 2.23 u[IU]/mL (ref 0.450–4.500)

## 2019-09-16 ENCOUNTER — Encounter: Payer: Self-pay | Admitting: Physician Assistant

## 2019-09-17 ENCOUNTER — Telehealth: Payer: Self-pay

## 2019-09-17 NOTE — Telephone Encounter (Signed)
Labs results were given per Dr Lamar Sprinkles note.

## 2019-09-20 ENCOUNTER — Other Ambulatory Visit: Payer: Self-pay | Admitting: Physician Assistant

## 2019-09-20 ENCOUNTER — Other Ambulatory Visit: Payer: BC Managed Care – PPO

## 2019-09-20 DIAGNOSIS — R1011 Right upper quadrant pain: Secondary | ICD-10-CM

## 2019-09-29 ENCOUNTER — Other Ambulatory Visit: Payer: Self-pay | Admitting: Physician Assistant

## 2019-10-01 ENCOUNTER — Other Ambulatory Visit: Payer: BC Managed Care – PPO

## 2019-10-04 ENCOUNTER — Other Ambulatory Visit: Payer: Self-pay

## 2019-10-04 DIAGNOSIS — R1011 Right upper quadrant pain: Secondary | ICD-10-CM

## 2019-10-15 ENCOUNTER — Other Ambulatory Visit: Payer: Self-pay

## 2019-10-15 ENCOUNTER — Other Ambulatory Visit: Payer: Self-pay | Admitting: Obstetrics

## 2019-10-15 ENCOUNTER — Ambulatory Visit
Admission: RE | Admit: 2019-10-15 | Discharge: 2019-10-15 | Disposition: A | Discharge status: Home / Self Care | Payer: BC Managed Care – PPO | Source: Ambulatory Visit | Attending: Obstetrics | Admitting: Obstetrics

## 2019-10-15 DIAGNOSIS — N6320 Unspecified lump in the left breast, unspecified quadrant: Secondary | ICD-10-CM

## 2019-10-22 ENCOUNTER — Ambulatory Visit: Payer: Self-pay | Admitting: General Surgery

## 2019-10-22 NOTE — H&P (Signed)
History of Present Illness Jenny Filler MD; 10/22/2019 10:29 AM) The patient is a 44 year old female who presents for evaluation of gall stones. Patient is a 44 year old female comes in secondary to epigastric/right upper quadrant abdominal pain. Patient states that this began over the last several months. He states this pain was associated with high fatty foods. States the pain was mainly in the right upper quadrant area. She states that this was associated with fatigue and malaise. Patient underwent H. pylori labs and was positive. She underwent treatment for H. pylori.  Patient states that subsequent to this she began to eat normally and continued with pain as well as fatigue. Patient underwent ultrasound revealed no gallstones. Patient also underwent HIDA scan which revealed an ejection fraction of 31%. Review the studies personally.  Patient did have LFTs for which were within normal limits.   Past Surgical History Micheal Likens, CMA; 10/22/2019 10:19 AM) Cesarean Section - Multiple   Diagnostic Studies History (Chanel Lonni Fix, CMA; 10/22/2019 10:19 AM) Colonoscopy  never Mammogram  within last year Pap Smear  1-5 years ago  Allergies Hedy Camara Lonni Fix, CMA; 10/22/2019 10:19 AM) No Known Drug Allergies  [10/22/2019]: Allergies Reconciled   Medication History (Chanel Lonni Fix, CMA; 10/22/2019 10:19 AM) Protonix (40MG  Tablet DR, Oral) Active. Medications Reconciled  Social History , CMA; 10/22/2019 10:19 AM) No alcohol use  No caffeine use  No drug use  Tobacco use  Never smoker.  Family History 10/24/2019, CMA; 10/22/2019 10:19 AM) Arthritis  Brother, Father, Mother. Colon Polyps  Father. Diabetes Mellitus  Mother. Thyroid problems  Sister.  Pregnancy / Birth History 10/24/2019, CMA; 10/22/2019 10:19 AM) Age at menarche  13 years. Contraceptive History  Oral contraceptives. Gravida  2 Maternal age  78-25 Para  2 Regular periods    Other Problems (Chanel 23-34, CMA; 10/22/2019 10:19 AM) No pertinent past medical history     Review of Systems 10/24/2019 MD; 10/22/2019 10:27 AM) General Not Present- Appetite Loss, Chills, Fatigue, Fever, Night Sweats, Weight Gain and Weight Loss. Skin Not Present- Change in Wart/Mole, Dryness, Hives, Jaundice, New Lesions, Non-Healing Wounds, Rash and Ulcer. HEENT Not Present- Earache, Hearing Loss, Hoarseness, Nose Bleed, Oral Ulcers, Ringing in the Ears, Seasonal Allergies, Sinus Pain, Sore Throat, Visual Disturbances, Wears glasses/contact lenses and Yellow Eyes. Respiratory Not Present- Bloody sputum, Chronic Cough, Difficulty Breathing, Snoring and Wheezing. Breast Not Present- Breast Mass, Breast Pain, Nipple Discharge and Skin Changes. Cardiovascular Not Present- Chest Pain, Difficulty Breathing Lying Down, Leg Cramps, Palpitations, Rapid Heart Rate, Shortness of Breath and Swelling of Extremities. Gastrointestinal Not Present- Abdominal Pain, Bloating, Bloody Stool, Change in Bowel Habits, Chronic diarrhea, Constipation, Difficulty Swallowing, Excessive gas, Gets full quickly at meals, Hemorrhoids, Indigestion, Nausea, Rectal Pain and Vomiting. Female Genitourinary Not Present- Frequency, Nocturia, Painful Urination, Pelvic Pain and Urgency. Musculoskeletal Not Present- Back Pain, Joint Pain, Joint Stiffness, Muscle Pain, Muscle Weakness and Swelling of Extremities. Neurological Not Present- Decreased Memory, Fainting, Headaches, Numbness, Seizures, Tingling, Tremor, Trouble walking and Weakness. Psychiatric Not Present- Anxiety, Bipolar, Change in Sleep Pattern, Depression, Fearful and Frequent crying. Endocrine Not Present- Cold Intolerance, Excessive Hunger, Hair Changes, Heat Intolerance, Hot flashes and New Diabetes. Hematology Not Present- Blood Thinners, Easy Bruising, Excessive bleeding, Gland problems, HIV and Persistent Infections. All other systems  negative  Vitals (Chanel Nolan CMA; 10/22/2019 10:20 AM) 10/22/2019 10:20 AM Weight: 168.25 lb Height: 61in Body Surface Area: 1.76 m Body Mass Index: 31.79 kg/m  Temp.: 98.70F  Pulse: 112 (Regular)  BP: 124/76(Sitting, Left Arm, Standard)       Physical Exam Jenny Filler MD; 10/22/2019 10:29 AM) The physical exam findings are as follows: Note: Constitutional: No acute distress, conversant, appears stated age  Eyes: Anicteric sclerae, moist conjunctiva, no lid lag  Neck: No thyromegaly, trachea midline, no cervical lymphadenopathy  Lungs: Clear to auscultation biilaterally, normal respiratory effot  Cardiovascular: regular rate & rhythm, no murmurs, no peripheal edema, pedal pulses 2+  GI: Soft, no masses or hepatosplenomegaly, non-tender to palpation  MSK: Normal gait, no clubbing cyanosis, edema  Skin: No rashes, palpation reveals normal skin turgor  Psychiatric: Appropriate judgment and insight, oriented to person, place, and time    Assessment & Plan Jenny Filler MD; 10/22/2019 10:30 AM) BILIARY DYSKINESIA (K82.8) Impression: Patient is a 44 year old female who comes in secondary to biliary dyskinesia with an ejection fraction of 31%. 1. We will proceed to the operating room for a laparoscopic cholecystectomy  2. Risks and benefits were discussed with the patient to generally include, but not limited to: infection, bleeding, possible need for post op ERCP, damage to the bile ducts, bile leak, and possible need for further surgery. Alternatives were offered and described. All questions were answered and the patient voiced understanding of the procedure and wishes to proceed at this point with a laparoscopic cholecystectomy

## 2019-11-05 ENCOUNTER — Ambulatory Visit: Payer: BC Managed Care – PPO | Admitting: Physician Assistant

## 2020-01-21 ENCOUNTER — Encounter: Payer: Self-pay | Admitting: Physician Assistant

## 2020-01-21 ENCOUNTER — Ambulatory Visit: Payer: BC Managed Care – PPO | Admitting: Physician Assistant

## 2020-01-21 ENCOUNTER — Other Ambulatory Visit: Payer: Self-pay

## 2020-01-21 VITALS — BP 110/74 | HR 80 | Temp 97.3°F | Ht 61.0 in | Wt 166.0 lb

## 2020-01-21 DIAGNOSIS — R1084 Generalized abdominal pain: Secondary | ICD-10-CM | POA: Diagnosis not present

## 2020-01-21 MED ORDER — CLARITHROMYCIN 500 MG PO TABS: 500.0000 mg | ORAL_TABLET | Freq: Two times a day (BID) | ORAL | 0 refills | Status: DC

## 2020-01-21 MED ORDER — AMOXICILLIN 500 MG PO CAPS: ORAL_CAPSULE | ORAL | 0 refills | Status: DC

## 2020-01-21 NOTE — Progress Notes (Signed)
Established Patient Office Visit  Subjective:  Patient ID: Jenny Daniel, female    DOB: 16-Feb-1975  Age: 44 y.o. MRN: [id number]  CC:  Chief Complaint  Patient presents with  . Abdominal pain    HPI NOVA SCHMUHL presents for abdominal pain Pt has had issues intermittently for several months with  pain, indigestion and nausea - she has not had vomiting She does have a history of Hpylori and feels this is similar She did have her gallbladder out and those symptoms resolved  Past Medical History:  Diagnosis Date  . Right upper quadrant pain     Past Surgical History:  Procedure Laterality Date  . CESAREAN SECTION     x2  . pilonidal cyst removal      Family History  Problem Relation Age of Onset  . Diabetes Mellitus II Other   . Hyperlipidemia Other   . Hypertension Other     Social History   Socioeconomic History  . Marital status: Married    Spouse name: Not on file  . Number of children: 2  . Years of education: Not on file  . Highest education level: Not on file  Occupational History  . 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 Smoking status: Never Smoker  . Smokeless tobacco: Never Used  Vaping Use  . Vaping Use: Never used  Substance and Sexual Activity  . Alcohol use: Not Currently  . Drug use: Never  . Sexual activity: Not on file  Other Topics Concern  . Not on file  Social History Narrative  . Not on file   Social Determinants of Health   Financial Resource Strain: Not on file  Food Insecurity: Not on file  Transportation Needs: Not on file  Physical Activity: Not on file  Stress: Not on file  Social Connections: Not on file  Intimate Partner Violence: Not on file     Current Outpatient Medications:  .  LORazepam (ATIVAN) 0.5 MG tablet, Take 1 tablet (0.5 mg total) by mouth daily as needed for anxiety., Disp: 30 tablet, Rfl: 0 .  pantoprazole (PROTONIX) 40 MG tablet, TAKE 1 TABLET BY MOUTH EVERY DAY, Disp: 90 tablet,  Rfl: 1 .  amoxicillin (AMOXIL) 500 MG capsule, 2 po bid, Disp: 56 capsule, Rfl: 0 .  clarithromycin (BIAXIN) 500 MG tablet, Take 1 tablet (500 mg total) by mouth 2 (two) times daily., Disp: 28 tablet, Rfl: 0   No Known Allergies  ROS CONSTITUTIONAL: Negative for chills, fatigue, fever, unintentional weight gain and unintentional weight loss.   CARDIOVASCULAR: Negative for chest pain, dizziness, palpitations and pedal edema.  RESPIRATORY: Negative for recent cough and dyspnea.  GASTROINTESTINAL:see HPI         Objective:    PHYSICAL EXAM:   VS: BP 110/74 (BP Location: Left Arm, Patient Position: Sitting, Cuff Size: Normal)   Pulse 80   Temp (!) 97.3 F (36.3 C) (Temporal)   Ht 5\' 1"  (1.549 m)   Wt 166 lb (75.3 kg)   SpO2 96%   BMI 31.37 kg/m   PHYSICAL EXAM:   VS: BP 110/74 (BP Location: Left Arm, Patient Position: Sitting, Cuff Size: Normal)   Pulse 80   Temp (!) 97.3 F (36.3 C) (Temporal)   Ht 5\' 1"  (1.549 m)   Wt 166 lb (75.3 kg)   SpO2 96%   BMI 31.37 kg/m   GEN: Well nourished, well developed, in no acute distress  Cardiac: RRR; no murmurs, rubs, or gallops,no  edema -  Respiratory:  normal respiratory rate and pattern with no distress - normal breath sounds with no rales, rhonchi, wheezes or rubs GI: normal bowel sounds, no masses or tenderness    BP 110/74 (BP Location: Left Arm, Patient Position: Sitting, Cuff Size: Normal)   Pulse 80   Temp (!) 97.3 F (36.3 C) (Temporal)   Ht 5\' 1"  (1.549 m)   Wt 166 lb (75.3 kg)   SpO2 96%   BMI 31.37 kg/m  Wt Readings from Last 3 Encounters:  01/21/20 166 lb (75.3 kg)  09/09/19 160 lb (72.6 kg)  09/05/19 168 lb (76.2 kg)     Health Maintenance Due  Topic Date Due  . Hepatitis C Screening  Never done  . COVID-19 Vaccine (1) Never done  . HIV Screening  Never done  . PAP SMEAR-Modifier  Never done    There are no preventive care reminders to display for this patient.  Lab Results  Component Value  Date   TSH 2.230 09/05/2019   Lab Results  Component Value Date   WBC 6.6 09/05/2019   HGB 14.7 09/05/2019   HCT 42.7 09/05/2019   MCV 87 09/05/2019   PLT 281 09/05/2019   Lab Results  Component Value Date   NA 142 09/05/2019   K 4.3 09/05/2019   CO2 22 09/05/2019   GLUCOSE 93 09/05/2019   BUN 8 09/05/2019   CREATININE 0.86 09/05/2019   BILITOT 0.2 09/05/2019   ALKPHOS 73 09/05/2019   AST 18 09/05/2019   ALT 23 09/05/2019   PROT 7.3 09/05/2019   ALBUMIN 4.4 09/05/2019   CALCIUM 9.6 09/05/2019   No results found for: CHOL No results found for: HDL No results found for: LDLCALC No results found for: TRIG No results found for: CHOLHDL No results found for: 11/05/2019    Assessment & Plan:   Problem List Items Addressed This Visit      Other   Generalized abdominal pain - Primary   Relevant Orders   CBC with Differential/Platelet   Comprehensive metabolic panel Will treat empirically for hpylori again - however if symptoms persist or recur recommend GI referral for endoscopy Continue protonix      Meds ordered this encounter  Medications  . amoxicillin (AMOXIL) 500 MG capsule    Sig: 2 po bid    Dispense:  56 capsule    Refill:  0    Order Specific Question:   Supervising Provider    [email or web address]  . clarithromycin (BIAXIN) 500 MG tablet    Sig: Take 1 tablet (500 mg total) by mouth 2 (two) times daily.    Dispense:  28 tablet    Refill:  0    Order Specific Question:   Supervising Provider    AnswerCorey Harold    Follow-up: No follow-ups on file.    SARA R Keymarion Bearman, PA-C

## 2020-01-22 LAB — COMPREHENSIVE METABOLIC PANEL
ALT: 17 IU/L (ref 0–32)
AST: 17 IU/L (ref 0–40)
Albumin/Globulin Ratio: 1.4 (ref 1.2–2.2)
Albumin: 4.3 g/dL (ref 3.8–4.8)
Alkaline Phosphatase: 63 IU/L (ref 44–121)
BUN/Creatinine Ratio: 14 (ref 9–23)
BUN: 12 mg/dL (ref 6–24)
Bilirubin Total: 0.4 mg/dL (ref 0.0–1.2)
CO2: 23 mmol/L (ref 20–29)
Calcium: 9.4 mg/dL (ref 8.7–10.2)
Chloride: 105 mmol/L (ref 96–106)
Creatinine, Ser: 0.84 mg/dL (ref 0.57–1.00)
GFR calc Af Amer: 98 mL/min/{1.73_m2} (ref >59)
GFR calc non Af Amer: 85 mL/min/{1.73_m2} (ref >59)
Globulin, Total: 3 g/dL (ref 1.5–4.5)
Glucose: 84 mg/dL (ref 65–99)
Potassium: 4.1 mmol/L (ref 3.5–5.2)
Sodium: 141 mmol/L (ref 134–144)
Total Protein: 7.3 g/dL (ref 6.0–8.5)

## 2020-01-22 LAB — CBC WITH DIFFERENTIAL/PLATELET
Basophils Absolute: 0.1 10*3/uL (ref 0.0–0.2)
Basos: 1 %
EOS (ABSOLUTE): 0.1 10*3/uL (ref 0.0–0.4)
Eos: 1 %
Hematocrit: 43.8 % (ref 34.0–46.6)
Hemoglobin: 14.5 g/dL (ref 11.1–15.9)
Immature Grans (Abs): 0 10*3/uL (ref 0.0–0.1)
Immature Granulocytes: 0 %
Lymphocytes Absolute: 1.5 10*3/uL (ref 0.7–3.1)
Lymphs: 25 %
MCH: 29 pg (ref 26.6–33.0)
MCHC: 33.1 g/dL (ref 31.5–35.7)
MCV: 88 fL (ref 79–97)
Monocytes Absolute: 0.6 10*3/uL (ref 0.1–0.9)
Monocytes: 10 %
Neutrophils Absolute: 3.9 10*3/uL (ref 1.4–7.0)
Neutrophils: 63 %
Platelets: 272 10*3/uL (ref 150–450)
RBC: 5 x10E6/uL (ref 3.77–5.28)
RDW: 12.3 % (ref 11.7–15.4)
WBC: 6.2 10*3/uL (ref 3.4–10.8)

## 2020-04-13 ENCOUNTER — Ambulatory Visit
Admission: RE | Admit: 2020-04-13 | Discharge: 2020-04-13 | Disposition: A | Discharge status: Home / Self Care | Payer: BC Managed Care – PPO | Source: Ambulatory Visit | Attending: Obstetrics | Admitting: Obstetrics

## 2020-04-13 ENCOUNTER — Other Ambulatory Visit: Payer: Self-pay

## 2020-04-13 DIAGNOSIS — N6320 Unspecified lump in the left breast, unspecified quadrant: Secondary | ICD-10-CM

## 2020-05-31 ENCOUNTER — Other Ambulatory Visit: Payer: Self-pay | Admitting: Physician Assistant

## 2020-08-04 ENCOUNTER — Ambulatory Visit: Payer: BC Managed Care – PPO | Admitting: Physician Assistant

## 2020-08-04 ENCOUNTER — Encounter: Payer: Self-pay | Admitting: Physician Assistant

## 2020-08-04 VITALS — BP 106/66 | HR 94 | Temp 97.9°F | Ht 61.0 in | Wt 171.2 lb

## 2020-08-04 DIAGNOSIS — J029 Acute pharyngitis, unspecified: Secondary | ICD-10-CM

## 2020-08-04 DIAGNOSIS — R5383 Other fatigue: Secondary | ICD-10-CM | POA: Diagnosis not present

## 2020-08-04 LAB — POC COVID19 BINAXNOW: SARS Coronavirus 2 Ag: NEGATIVE

## 2020-08-04 LAB — POCT RAPID STREP A: Strep A Ag: NOT DETECTED

## 2020-08-04 NOTE — Progress Notes (Signed)
Acute Office Visit  Subjective:    Patient ID: Jenny Daniel, female    DOB: 27-Dec-1975, 45 y.o.   MRN: [id number]  Chief Complaint  Patient presents with   Fatigue    Swollen throat    HPI Patient is in today for fatigue - she states that she has felt weak and fatigued for several weeks - she denies fever, rash, cold, cough, and congestion - has not had any tick exposure  Past Medical History:  Diagnosis Date   Right upper quadrant pain     Past Surgical History:  Procedure Laterality Date   CESAREAN SECTION     x2   pilonidal cyst removal      Family History  Problem Relation Age of Onset   Diabetes Mellitus II Other    Hyperlipidemia Other    Hypertension Other     Social History   Socioeconomic History   Marital status: Married    Spouse name: Not on file   Number of children: 2   Years of education: Not on file   Highest education level: Not on file  Occupational History   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  Tobacco Use   Smoking status: Never   Smokeless tobacco: Never  Vaping Use   Vaping Use: Never used  Substance and Sexual Activity   Alcohol use: Not Currently   Drug use: Never   Sexual activity: Not on file  Other Topics Concern   Not on file  Social History Narrative   Not on file   Social Determinants of Health   Financial Resource Strain: Not on file  Food Insecurity: Not on file  Transportation Needs: Not on file  Physical Activity: Not on file  Stress: Not on file  Social Connections: Not on file  Intimate Partner Violence: Not on file    Outpatient Medications Prior to Visit  Medication Sig Dispense Refill   pantoprazole (PROTONIX) 40 MG tablet TAKE 1 TABLET BY MOUTH EVERY DAY 90 tablet 1   amoxicillin (AMOXIL) 500 MG capsule 2 po bid 56 capsule 0   clarithromycin (BIAXIN) 500 MG tablet Take 1 tablet (500 mg total) by mouth 2 (two) times daily. 28 tablet 0   LORazepam (ATIVAN) 0.5 MG tablet Take 1 tablet (0.5 mg total) by  mouth daily as needed for anxiety. 30 tablet 0   No facility-administered medications prior to visit.    No Known Allergies  Review of Systems CONSTITUTIONAL:see HPI E/N/T: see HPI  CARDIOVASCULAR: Negative for chest pain, dizziness, palpitations and pedal edema.  RESPIRATORY: Negative for recent cough and dyspnea.  GASTROINTESTINAL: Negative for abdominal pain, acid reflux symptoms, constipation, diarrhea, nausea and vomiting.  MSK: Negative for arthralgias and myalgias.  INTEGUMENTARY: Negative for rash.  NEUROLOGICAL: Negative for dizziness and headaches.  PSYCHIATRIC: Negative for sleep disturbance and to question depression screen.  Negative for depression, negative for anhedonia.         Objective:    Physical Exam PHYSICAL EXAM:   VS: BP 106/66 (BP Location: Left Arm, Patient Position: Sitting, Cuff Size: Normal)   Pulse 94   Temp 97.9 F (36.6 C) (Temporal)   Ht 5\' 1"  (1.549 m)   Wt 171 lb 3.2 oz (77.7 kg)   SpO2 99%   BMI 32.35 kg/m   GEN: Well nourished, well developed, in no acute distress  HEENT: normal external ears and nose - normal external auditory canals and TMS -- Lips, Teeth and Gums - normal  Oropharynx - normal mucosa,  palate, and posterior pharynx Neck: no JVD or masses - no thyromegaly Cardiac: RRR; no murmurs, rubs, or gallops,no edema  Respiratory:  normal respiratory rate and pattern with no distress - normal breath sounds with no rales, rhonchi, wheezes or rubs Psych: euthymic mood, appropriate affect and demeanor  BP 106/66 (BP Location: Left Arm, Patient Position: Sitting, Cuff Size: Normal)   Pulse 94   Temp 97.9 F (36.6 C) (Temporal)   Ht 5\' 1"  (1.549 m)   Wt 171 lb 3.2 oz (77.7 kg)   SpO2 99%   BMI 32.35 kg/m  Wt Readings from Last 3 Encounters:  08/04/20 171 lb 3.2 oz (77.7 kg)  01/21/20 166 lb (75.3 kg)  09/09/19 160 lb (72.6 kg)    Health Maintenance Due  Topic Date Due   HIV Screening  Never done   Hepatitis C Screening   Never done   PAP SMEAR-Modifier  Never done    There are no preventive care reminders to display for this patient.   Lab Results  Component Value Date   TSH 2.230 09/05/2019   Lab Results  Component Value Date   WBC 6.2 01/21/2020   HGB 14.5 01/21/2020   HCT 43.8 01/21/2020   MCV 88 01/21/2020   PLT 272 01/21/2020   Lab Results  Component Value Date   NA 141 01/21/2020   K 4.1 01/21/2020   CO2 23 01/21/2020   GLUCOSE 84 01/21/2020   BUN 12 01/21/2020   CREATININE 0.84 01/21/2020   BILITOT 0.4 01/21/2020   ALKPHOS 63 01/21/2020   AST 17 01/21/2020   ALT 17 01/21/2020   PROT 7.3 01/21/2020   ALBUMIN 4.3 01/21/2020   CALCIUM 9.4 01/21/2020   No results found for: CHOL No results found for: HDL No results found for: LDLCALC No results found for: TRIG No results found for: CHOLHDL No results found for: 01/23/2020     Assessment & Plan:  1. Other fatigue - POC COVID-19 BinaxNow - CBC with Differential/Platelet - Comprehensive metabolic panel - Thyroid Panel With TSH - VITAMIN D 25 Hydroxy (Vit-D Deficiency, Fractures) - EPSTEIN-BARR VIRUS (EBV) Antibody Profile  2. Pharyngitis, unspecified etiology - POCT Rapid Strep A    No orders of the defined types were placed in this encounter.   Orders Placed This Encounter  Procedures   CBC with Differential/Platelet   Comprehensive metabolic panel   Thyroid Panel With TSH   VITAMIN D 25 Hydroxy (Vit-D Deficiency, Fractures)   EPSTEIN-BARR VIRUS (EBV) Antibody Profile   POC COVID-19 BinaxNow      Follow-up: Return if symptoms worsen or fail to improve.  An After Visit Summary was printed and given to the patient.  [id number] [phone]

## 2020-08-05 LAB — CBC WITH DIFFERENTIAL/PLATELET
Basophils Absolute: 0 10*3/uL (ref 0.0–0.2)
Basos: 1 %
EOS (ABSOLUTE): 0.1 10*3/uL (ref 0.0–0.4)
Eos: 2 %
Hematocrit: 41.9 % (ref 34.0–46.6)
Hemoglobin: 13.7 g/dL (ref 11.1–15.9)
Immature Grans (Abs): 0 10*3/uL (ref 0.0–0.1)
Immature Granulocytes: 0 %
Lymphocytes Absolute: 1.6 10*3/uL (ref 0.7–3.1)
Lymphs: 27 %
MCH: 29.5 pg (ref 26.6–33.0)
MCHC: 32.7 g/dL (ref 31.5–35.7)
MCV: 90 fL (ref 79–97)
Monocytes Absolute: 0.5 10*3/uL (ref 0.1–0.9)
Monocytes: 9 %
Neutrophils Absolute: 3.5 10*3/uL (ref 1.4–7.0)
Neutrophils: 61 %
Platelets: 270 10*3/uL (ref 150–450)
RBC: 4.65 x10E6/uL (ref 3.77–5.28)
RDW: 11.8 % (ref 11.7–15.4)
WBC: 5.8 10*3/uL (ref 3.4–10.8)

## 2020-08-05 LAB — COMPREHENSIVE METABOLIC PANEL
ALT: 17 IU/L (ref 0–32)
AST: 21 IU/L (ref 0–40)
Albumin/Globulin Ratio: 1.7 (ref 1.2–2.2)
Albumin: 4.3 g/dL (ref 3.8–4.8)
Alkaline Phosphatase: 62 IU/L (ref 44–121)
BUN/Creatinine Ratio: 13 (ref 9–23)
BUN: 11 mg/dL (ref 6–24)
Bilirubin Total: 0.2 mg/dL (ref 0.0–1.2)
CO2: 25 mmol/L (ref 20–29)
Calcium: 9 mg/dL (ref 8.7–10.2)
Chloride: 105 mmol/L (ref 96–106)
Creatinine, Ser: 0.84 mg/dL (ref 0.57–1.00)
Globulin, Total: 2.6 g/dL (ref 1.5–4.5)
Glucose: 100 mg/dL — ABNORMAL HIGH (ref 65–99)
Potassium: 3.7 mmol/L (ref 3.5–5.2)
Sodium: 143 mmol/L (ref 134–144)
Total Protein: 6.9 g/dL (ref 6.0–8.5)
eGFR: 88 mL/min/{1.73_m2} (ref >59)

## 2020-08-05 LAB — EPSTEIN-BARR VIRUS (EBV) ANTIBODY PROFILE
EBV NA IgG: >600 U/mL — ABNORMAL HIGH (ref 0.0–17.9)
EBV VCA IgG: 120 U/mL — ABNORMAL HIGH (ref 0.0–17.9)
EBV VCA IgM: <36 U/mL (ref 0.0–35.9)

## 2020-08-05 LAB — VITAMIN D 25 HYDROXY (VIT D DEFICIENCY, FRACTURES): Vit D, 25-Hydroxy: 37.8 ng/mL (ref 30.0–100.0)

## 2020-08-05 LAB — THYROID PANEL WITH TSH
Free Thyroxine Index: 2.1 (ref 1.2–4.9)
T3 Uptake Ratio: 24 % (ref 24–39)
T4, Total: 8.8 ug/dL (ref 4.5–12.0)
TSH: 1.96 u[IU]/mL (ref 0.450–4.500)

## 2020-12-10 ENCOUNTER — Encounter: Payer: Self-pay | Admitting: Physician Assistant

## 2020-12-10 ENCOUNTER — Other Ambulatory Visit: Payer: Self-pay

## 2020-12-10 ENCOUNTER — Ambulatory Visit (INDEPENDENT_AMBULATORY_CARE_PROVIDER_SITE_OTHER): Payer: BC Managed Care – PPO | Admitting: Physician Assistant

## 2020-12-10 VITALS — BP 110/60 | HR 111 | Temp 98.0°F | Resp 15 | Ht 61.0 in | Wt 181.0 lb

## 2020-12-10 DIAGNOSIS — R42 Dizziness and giddiness: Secondary | ICD-10-CM

## 2020-12-10 DIAGNOSIS — J0110 Acute frontal sinusitis, unspecified: Secondary | ICD-10-CM | POA: Diagnosis not present

## 2020-12-10 MED ORDER — AZITHROMYCIN 250 MG PO TABS: ORAL_TABLET | ORAL | 0 refills | Status: AC

## 2020-12-10 MED ORDER — FLUTICASONE PROPIONATE 50 MCG/ACT NA SUSP: 2.0000 | Freq: Every day | NASAL | 6 refills | Status: DC

## 2020-12-10 NOTE — Progress Notes (Signed)
Acute Office Visit  Subjective:    Patient ID: Jenny Daniel, female    DOB: Sep 20, 1975, 45 y.o.   MRN: [id number]  Chief Complaint  Patient presents with   Sinus Problem   Dizziness    HPI: Patient is in today for complaints of sinus pressure, ear pain and dizziness since last week - symptoms have been intermittent States worse with turning head certain head ways She also states dizziness worse looking at computer screen and scrolling on phone  Past Medical History:  Diagnosis Date   Right upper quadrant pain     Past Surgical History:  Procedure Laterality Date   CESAREAN SECTION     x2   pilonidal cyst removal      Family History  Problem Relation Age of Onset   Diabetes Mellitus II Other    Hyperlipidemia Other    Hypertension Other     Social History   Socioeconomic History   Marital status: Married    Spouse name: Not on file   Number of children: 2   Years of education: Not on file   Highest education level: Not on file  Occupational History   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  Tobacco Use   Smoking status: Never   Smokeless tobacco: Never  Vaping Use   Vaping Use: Never used  Substance and Sexual Activity   Alcohol use: Not Currently   Drug use: Never   Sexual activity: Yes    Partners: Male  Other Topics Concern   Not on file  Social History Narrative   Not on file   Social Determinants of Health   Financial Resource Strain: Not on file  Food Insecurity: Not on file  Transportation Needs: Not on file  Physical Activity: Not on file  Stress: Not on file  Social Connections: Not on file  Intimate Partner Violence: Not on file    Outpatient Medications Prior to Visit  Medication Sig Dispense Refill   pantoprazole (PROTONIX) 40 MG tablet TAKE 1 TABLET BY MOUTH EVERY DAY 90 tablet 1   No facility-administered medications prior to visit.    No Known Allergies  Review of Systems CONSTITUTIONAL: Negative for chills, fatigue, fever,  unintentional weight gain and unintentional weight loss.  E/N/T: see HPI CARDIOVASCULAR: Negative for chest pain, dizziness, palpitations and pedal edema.  RESPIRATORY: Negative for recent cough and dyspnea.  INTEGUMENTARY: Negative for rash.  NEUROLOGICAL:see HPI PSYCHIATRIC: Negative for sleep disturbance and to question depression screen.  Negative for depression, negative for anhedonia.         Objective:    Physical Exam PHYSICAL EXAM:   VS: BP 110/60   Pulse (!) 111   Temp 98 F (36.7 C)   Resp 15   Ht 5' 1"  (1.549 m)   Wt 181 lb (82.1 kg)   LMP 11/28/2020   SpO2 99%   BMI 34.20 kg/m   GEN: Well nourished, well developed, in no acute distress  HEENT: normal external ears and nose - left TM normal - right tm dull - fluid noted- hearing grossly normal - Lips, Teeth and Gums - normal  Oropharynx -mild erythema Cardiac: RRR; no murmurs, rubs, or gallops,no edema - no significant varicositiesdistress - normal breath sounds with no rales, rhonchi, wheezes or rubs Skin: warm and dry, no rash  Neuro:  Alert and Oriented x 3,  - CN II-Xii grossly intact Psych: euthymic mood, appropriate affect and demeanor  BP 110/60   Pulse (!) 111   Temp 98  F (36.7 C)   Resp 15   Ht 5' 1"  (1.549 m)   Wt 181 lb (82.1 kg)   LMP 11/28/2020   SpO2 99%   BMI 34.20 kg/m  Wt Readings from Last 3 Encounters:  12/10/20 181 lb (82.1 kg)  08/04/20 171 lb 3.2 oz (77.7 kg)  01/21/20 166 lb (75.3 kg)    Health Maintenance Due  Topic Date Due   HIV Screening  Never done   Hepatitis C Screening  Never done   PAP SMEAR-Modifier  Never done    There are no preventive care reminders to display for this patient.   Lab Results  Component Value Date   TSH 1.960 08/04/2020   Lab Results  Component Value Date   WBC 5.8 08/04/2020   HGB 13.7 08/04/2020   HCT 41.9 08/04/2020   MCV 90 08/04/2020   PLT 270 08/04/2020   Lab Results  Component Value Date   NA 143 08/04/2020   K 3.7  08/04/2020   CO2 25 08/04/2020   GLUCOSE 100 (H) 08/04/2020   BUN 11 08/04/2020   CREATININE 0.84 08/04/2020   BILITOT 0.2 08/04/2020   ALKPHOS 62 08/04/2020   AST 21 08/04/2020   ALT 17 08/04/2020   PROT 6.9 08/04/2020   ALBUMIN 4.3 08/04/2020   CALCIUM 9.0 08/04/2020   EGFR 88 08/04/2020   No results found for: CHOL No results found for: HDL No results found for: LDLCALC No results found for: TRIG No results found for: CHOLHDL No results found for: HGBA1C     Assessment & Plan:   Problem List Items Addressed This Visit   None Visit Diagnoses     Dizziness    -  Primary   Relevant Orders   CBC with Differential/Platelet   Comprehensive metabolic panel   TSH   Acute non-recurrent frontal sinusitis       Relevant Medications   azithromycin (ZITHROMAX) 250 MG tablet   fluticasone (FLONASE) 50 MCG/ACT nasal spray      Meds ordered this encounter  Medications   azithromycin (ZITHROMAX) 250 MG tablet    Sig: Take 2 tablets on day 1, then 1 tablet daily on days 2 through 5    Dispense:  6 tablet    Refill:  0    Order Specific Question:   Supervising Provider    Answer:   [facility], Elnita Maxwell [[id number]]   fluticasone (FLONASE) 50 MCG/ACT nasal spray    Sig: Place 2 sprays into both nostrils daily.    Dispense:  16 g    Refill:  6    Order Specific Question:   Supervising Provider    AnswerShelton Silvas    Orders Placed This Encounter  Procedures   CBC with Differential/Platelet   Comprehensive metabolic panel   TSH   Recommend to get formal eye exam  Follow-up: Return if symptoms worsen or fail to improve.  An After Visit Summary was printed and given to the patient.  Yetta Flock [facility] [phone]

## 2020-12-11 LAB — COMPREHENSIVE METABOLIC PANEL
ALT: 21 IU/L (ref 0–32)
AST: 18 IU/L (ref 0–40)
Albumin/Globulin Ratio: 1.5 (ref 1.2–2.2)
Albumin: 4.1 g/dL (ref 3.8–4.8)
Alkaline Phosphatase: 61 IU/L (ref 44–121)
BUN/Creatinine Ratio: 11 (ref 9–23)
BUN: 11 mg/dL (ref 6–24)
Bilirubin Total: 0.2 mg/dL (ref 0.0–1.2)
CO2: 25 mmol/L (ref 20–29)
Calcium: 9.5 mg/dL (ref 8.7–10.2)
Chloride: 103 mmol/L (ref 96–106)
Creatinine, Ser: 0.97 mg/dL (ref 0.57–1.00)
Globulin, Total: 2.7 g/dL (ref 1.5–4.5)
Glucose: 91 mg/dL (ref 70–99)
Potassium: 4 mmol/L (ref 3.5–5.2)
Sodium: 145 mmol/L — ABNORMAL HIGH (ref 134–144)
Total Protein: 6.8 g/dL (ref 6.0–8.5)
eGFR: 74 mL/min/{1.73_m2} (ref >59)

## 2020-12-11 LAB — CBC WITH DIFFERENTIAL/PLATELET
Basophils Absolute: 0.1 10*3/uL (ref 0.0–0.2)
Basos: 1 %
EOS (ABSOLUTE): 0.1 10*3/uL (ref 0.0–0.4)
Eos: 1 %
Hematocrit: 43.8 % (ref 34.0–46.6)
Hemoglobin: 14.2 g/dL (ref 11.1–15.9)
Immature Grans (Abs): 0 10*3/uL (ref 0.0–0.1)
Immature Granulocytes: 1 %
Lymphocytes Absolute: 1.4 10*3/uL (ref 0.7–3.1)
Lymphs: 23 %
MCH: 28.9 pg (ref 26.6–33.0)
MCHC: 32.4 g/dL (ref 31.5–35.7)
MCV: 89 fL (ref 79–97)
Monocytes Absolute: 0.6 10*3/uL (ref 0.1–0.9)
Monocytes: 9 %
Neutrophils Absolute: 4.2 10*3/uL (ref 1.4–7.0)
Neutrophils: 65 %
Platelets: 288 10*3/uL (ref 150–450)
RBC: 4.91 x10E6/uL (ref 3.77–5.28)
RDW: 11.6 % — ABNORMAL LOW (ref 11.7–15.4)
WBC: 6.3 10*3/uL (ref 3.4–10.8)

## 2020-12-11 LAB — TSH: TSH: 1.92 u[IU]/mL (ref 0.450–4.500)

## 2021-01-07 ENCOUNTER — Other Ambulatory Visit: Payer: Self-pay | Admitting: Physician Assistant

## 2021-01-07 MED ORDER — SCOPOLAMINE 1 MG/3DAYS TD PT72: 1.0000 | MEDICATED_PATCH | TRANSDERMAL | 0 refills | Status: DC

## 2021-03-10 ENCOUNTER — Other Ambulatory Visit: Payer: Self-pay | Admitting: Obstetrics

## 2021-03-10 DIAGNOSIS — Z09 Encounter for follow-up examination after completed treatment for conditions other than malignant neoplasm: Secondary | ICD-10-CM

## 2021-04-20 ENCOUNTER — Ambulatory Visit
Admission: RE | Admit: 2021-04-20 | Discharge: 2021-04-20 | Disposition: A | Discharge status: Home / Self Care | Payer: BC Managed Care – PPO | Source: Ambulatory Visit | Attending: Obstetrics | Admitting: Obstetrics

## 2021-04-20 DIAGNOSIS — Z09 Encounter for follow-up examination after completed treatment for conditions other than malignant neoplasm: Secondary | ICD-10-CM

## 2021-07-08 ENCOUNTER — Other Ambulatory Visit: Payer: Self-pay | Admitting: Physician Assistant

## 2022-01-13 ENCOUNTER — Other Ambulatory Visit: Payer: Self-pay | Admitting: Physician Assistant

## 2022-11-29 ENCOUNTER — Ambulatory Visit: Payer: BC Managed Care – PPO | Admitting: Physician Assistant

## 2022-11-29 ENCOUNTER — Encounter: Payer: Self-pay | Admitting: Physician Assistant

## 2022-11-29 VITALS — BP 118/60 | HR 100 | Ht 61.0 in | Wt 182.8 lb

## 2022-11-29 DIAGNOSIS — Z1211 Encounter for screening for malignant neoplasm of colon: Secondary | ICD-10-CM | POA: Diagnosis not present

## 2022-11-29 DIAGNOSIS — N921 Excessive and frequent menstruation with irregular cycle: Secondary | ICD-10-CM

## 2022-11-29 DIAGNOSIS — K5792 Diverticulitis of intestine, part unspecified, without perforation or abscess without bleeding: Secondary | ICD-10-CM

## 2022-11-29 NOTE — Progress Notes (Signed)
Acute Office Visit  Subjective:    Patient ID: Jenny Daniel, female    DOB: [birth year], 47 y.o.   MRN: [id number]  Chief Complaint  Patient presents with   Abdominal Pain    Left side    HPI: Patient is in today for complaints of LLQ pain.  Pt states about a month ago she noted blood with wiping and some left lower quadrant tenderness.  She had been eating a bit of popcorn.  Those symptoms resolved but then again last week she had been eating moderate amount of popcorn and sunflower seeds and had pain in left lower side and malaise.   She did note bleeding with wiping again.  She did a televisit and prescribed cipro and flagyl.  She states today overall she is feeling better.   Denies nausea, vomiting , fever She is due for screening colonoscopy  Pt also mentions that over the past few months she has been having significantly heavy periods every 2 weeks.  She has seen GYN and mentioned these issues but no follow up/testing done other than a normal pap smear a few months ago per patient.  She feels those symptoms have worsened slightly - now even bleeding through clothes at times   Current Outpatient Medications:    ciprofloxacin (CIPRO) 500 MG tablet, SMARTSIG:1 Tablet(s) By Mouth Every 12 Hours, Disp: , Rfl:    metroNIDAZOLE (FLAGYL) 500 MG tablet, Take 500 mg by mouth every 8 (eight) hours., Disp: , Rfl:    fluticasone (FLONASE) 50 MCG/ACT nasal spray, Place 2 sprays into both nostrils daily. (Patient not taking: Reported on 11/29/2022), Disp: 16 g, Rfl: 6   pantoprazole (PROTONIX) 40 MG tablet, TAKE 1 TABLET BY MOUTH EVERY DAY (Patient not taking: Reported on 11/29/2022), Disp: 90 tablet, Rfl: 1   scopolamine (TRANSDERM-SCOP) 1 MG/3DAYS, Place 1 patch (1.5 mg total) onto the skin every 3 (three) days. (Patient not taking: Reported on 11/29/2022), Disp: 10 patch, Rfl: 0  No Known Allergies  ROS CONSTITUTIONAL: Negative for chills, fatigue, fever, unintentional weight gain and  unintentional weight loss.  E/N/T: Negative for ear pain, nasal congestion and sore throat.  CARDIOVASCULAR: Negative for chest pain, dizziness, palpitations and pedal edema.  RESPIRATORY: Negative for recent cough and dyspnea.  GASTROINTESTINAL: see HPI GU - see HPI INTEGUMENTARY: Negative for rash.      Objective:    PHYSICAL EXAM:   BP 118/60   Pulse 100   Ht 5\' 1"  (1.549 m)   Wt 182 lb 12.8 oz (82.9 kg)   SpO2 98%   BMI 34.54 kg/m    GEN: Well nourished, well developed, in no acute distress   Cardiac: RRR; no murmurs, rubs, or gallops,no edema -  Respiratory:  normal respiratory rate and pattern with no distress - normal breath sounds with no rales, rhonchi, wheezes or rubs GI: normal bowel sounds or tenderness - however fullness is noted suprapubically MS: no deformity or atrophy  Skin: warm and dry, no rash  Neuro:  Alert and Oriented x 3,  CN II-Xii grossly intact Psych: euthymic mood, appropriate affect and demeanor     Assessment & Plan:    Diverticulitis -     CBC with Differential/Platelet -     Comprehensive metabolic panel -     Ambulatory referral to Gastroenterology Complete antibiotics Menorrhagia with irregular cycle -     CBC with Differential/Platelet -     Comprehensive metabolic panel -     TSH -  US PELVIC COMPLETE WITH TRANSVAGINAL; Future  Colon cancer screening -     Ambulatory referral to Gastroenterology     Follow-up: Return in about 2 months (around 01/29/2023), or if symptoms worsen or fail to improve, for follow-up.  An After Visit Summary was printed and given to the patient.  Jettie Pagan [facility] [phone]

## 2022-11-30 LAB — COMPREHENSIVE METABOLIC PANEL
ALT: 20 [IU]/L (ref 0–32)
AST: 22 [IU]/L (ref 0–40)
Albumin: 4.3 g/dL (ref 3.9–4.9)
Alkaline Phosphatase: 73 [IU]/L (ref 44–121)
BUN/Creatinine Ratio: 8 — ABNORMAL LOW (ref 9–23)
BUN: 7 mg/dL (ref 6–24)
Bilirubin Total: 0.2 mg/dL (ref 0.0–1.2)
CO2: 22 mmol/L (ref 20–29)
Calcium: 9.6 mg/dL (ref 8.7–10.2)
Chloride: 104 mmol/L (ref 96–106)
Creatinine, Ser: 0.83 mg/dL (ref 0.57–1.00)
Globulin, Total: 3 g/dL (ref 1.5–4.5)
Glucose: 95 mg/dL (ref 70–99)
Potassium: 4 mmol/L (ref 3.5–5.2)
Sodium: 141 mmol/L (ref 134–144)
Total Protein: 7.3 g/dL (ref 6.0–8.5)
eGFR: 88 mL/min/{1.73_m2} (ref >59)

## 2022-11-30 LAB — CBC WITH DIFFERENTIAL/PLATELET
Basophils Absolute: 0 10*3/uL (ref 0.0–0.2)
Basos: 1 %
EOS (ABSOLUTE): 0 10*3/uL (ref 0.0–0.4)
Eos: 1 %
Hematocrit: 43 % (ref 34.0–46.6)
Hemoglobin: 13.8 g/dL (ref 11.1–15.9)
Immature Grans (Abs): 0 10*3/uL (ref 0.0–0.1)
Immature Granulocytes: 0 %
Lymphocytes Absolute: 1 10*3/uL (ref 0.7–3.1)
Lymphs: 18 %
MCH: 28.8 pg (ref 26.6–33.0)
MCHC: 32.1 g/dL (ref 31.5–35.7)
MCV: 90 fL (ref 79–97)
Monocytes Absolute: 0.4 10*3/uL (ref 0.1–0.9)
Monocytes: 8 %
Neutrophils Absolute: 3.9 10*3/uL (ref 1.4–7.0)
Neutrophils: 72 %
Platelets: 360 10*3/uL (ref 150–450)
RBC: 4.79 x10E6/uL (ref 3.77–5.28)
RDW: 11.1 % — ABNORMAL LOW (ref 11.7–15.4)
WBC: 5.3 10*3/uL (ref 3.4–10.8)

## 2022-11-30 LAB — TSH: TSH: 1.72 u[IU]/mL (ref 0.450–4.500)

## 2022-12-01 ENCOUNTER — Encounter: Payer: Self-pay | Admitting: Physician Assistant

## 2022-12-05 ENCOUNTER — Encounter: Payer: Self-pay | Admitting: Physician Assistant

## 2023-01-30 ENCOUNTER — Ambulatory Visit: Payer: BC Managed Care – PPO | Admitting: Physician Assistant

## 2023-03-15 ENCOUNTER — Ambulatory Visit: Payer: BC Managed Care – PPO | Admitting: Gastroenterology

## 2023-07-21 ENCOUNTER — Other Ambulatory Visit: Payer: Self-pay | Admitting: Obstetrics

## 2023-07-21 DIAGNOSIS — R928 Other abnormal and inconclusive findings on diagnostic imaging of breast: Secondary | ICD-10-CM

## 2023-08-01 ENCOUNTER — Ambulatory Visit

## 2023-08-01 ENCOUNTER — Ambulatory Visit
Admission: RE | Admit: 2023-08-01 | Discharge: 2023-08-01 | Disposition: A | Discharge status: Home / Self Care | Source: Ambulatory Visit | Attending: Obstetrics

## 2023-08-01 DIAGNOSIS — R928 Other abnormal and inconclusive findings on diagnostic imaging of breast: Secondary | ICD-10-CM

## 2023-08-15 LAB — COLOGUARD: COLOGUARD: NEGATIVE

## 2023-08-20 ENCOUNTER — Inpatient Hospital Stay (HOSPITAL_BASED_OUTPATIENT_CLINIC_OR_DEPARTMENT_OTHER)
Admission: EM | Admit: 2023-08-20 | Discharge: 2023-08-23 | DRG: 759 | Disposition: A | Discharge status: Home / Self Care | Attending: Obstetrics and Gynecology | Admitting: Obstetrics and Gynecology

## 2023-08-20 ENCOUNTER — Ambulatory Visit (HOSPITAL_BASED_OUTPATIENT_CLINIC_OR_DEPARTMENT_OTHER)
Admission: EM | Admit: 2023-08-20 | Discharge: 2023-08-20 | Disposition: A | Discharge status: Home / Self Care | Attending: Family Medicine | Admitting: Family Medicine

## 2023-08-20 ENCOUNTER — Emergency Department (HOSPITAL_BASED_OUTPATIENT_CLINIC_OR_DEPARTMENT_OTHER)

## 2023-08-20 ENCOUNTER — Encounter (HOSPITAL_BASED_OUTPATIENT_CLINIC_OR_DEPARTMENT_OTHER): Payer: Self-pay

## 2023-08-20 ENCOUNTER — Encounter (HOSPITAL_BASED_OUTPATIENT_CLINIC_OR_DEPARTMENT_OTHER): Payer: Self-pay | Admitting: Emergency Medicine

## 2023-08-20 DIAGNOSIS — R059 Cough, unspecified: Secondary | ICD-10-CM | POA: Diagnosis present

## 2023-08-20 DIAGNOSIS — R1030 Lower abdominal pain, unspecified: Secondary | ICD-10-CM

## 2023-08-20 DIAGNOSIS — N938 Other specified abnormal uterine and vaginal bleeding: Secondary | ICD-10-CM | POA: Diagnosis present

## 2023-08-20 DIAGNOSIS — Z8249 Family history of ischemic heart disease and other diseases of the circulatory system: Secondary | ICD-10-CM

## 2023-08-20 DIAGNOSIS — R109 Unspecified abdominal pain: Secondary | ICD-10-CM | POA: Diagnosis not present

## 2023-08-20 DIAGNOSIS — N7093 Salpingitis and oophoritis, unspecified: Secondary | ICD-10-CM | POA: Diagnosis not present

## 2023-08-20 DIAGNOSIS — Z833 Family history of diabetes mellitus: Secondary | ICD-10-CM

## 2023-08-20 LAB — CBC WITH DIFFERENTIAL/PLATELET
Abs Immature Granulocytes: 0.06 K/uL (ref 0.00–0.07)
Basophils Absolute: 0 K/uL (ref 0.0–0.1)
Basophils Relative: 0 %
Eosinophils Absolute: 0 K/uL (ref 0.0–0.5)
Eosinophils Relative: 0 %
HCT: 36.9 % (ref 36.0–46.0)
Hemoglobin: 12.5 g/dL (ref 12.0–15.0)
Immature Granulocytes: 0 %
Lymphocytes Relative: 7 %
Lymphs Abs: 1 K/uL (ref 0.7–4.0)
MCH: 30.1 pg (ref 26.0–34.0)
MCHC: 33.9 g/dL (ref 30.0–36.0)
MCV: 88.9 fL (ref 80.0–100.0)
Monocytes Absolute: 1.1 K/uL — ABNORMAL HIGH (ref 0.1–1.0)
Monocytes Relative: 8 %
Neutro Abs: 11.2 K/uL — ABNORMAL HIGH (ref 1.7–7.7)
Neutrophils Relative %: 85 %
Platelets: 247 K/uL (ref 150–400)
RBC: 4.15 MIL/uL (ref 3.87–5.11)
RDW: 13.2 % (ref 11.5–15.5)
WBC: 13.4 K/uL — ABNORMAL HIGH (ref 4.0–10.5)
nRBC: 0 % (ref 0.0–0.2)

## 2023-08-20 LAB — COMPREHENSIVE METABOLIC PANEL WITH GFR
ALT: 19 U/L (ref 0–44)
AST: 22 U/L (ref 15–41)
Albumin: 3.7 g/dL (ref 3.5–5.0)
Alkaline Phosphatase: 76 U/L (ref 38–126)
Anion gap: 14 (ref 5–15)
BUN: 8 mg/dL (ref 6–20)
CO2: 20 mmol/L — ABNORMAL LOW (ref 22–32)
Calcium: 9 mg/dL (ref 8.9–10.3)
Chloride: 104 mmol/L (ref 98–111)
Creatinine, Ser: 0.84 mg/dL (ref 0.44–1.00)
GFR, Estimated: >60 mL/min (ref >60)
Glucose, Bld: 111 mg/dL — ABNORMAL HIGH (ref 70–99)
Potassium: 3.6 mmol/L (ref 3.5–5.1)
Sodium: 137 mmol/L (ref 135–145)
Total Bilirubin: 0.4 mg/dL (ref 0.0–1.2)
Total Protein: 7.4 g/dL (ref 6.5–8.1)

## 2023-08-20 LAB — URINALYSIS, ROUTINE W REFLEX MICROSCOPIC
Glucose, UA: NEGATIVE mg/dL
Ketones, ur: 80 mg/dL — AB
Leukocytes,Ua: NEGATIVE
Nitrite: NEGATIVE
Protein, ur: 100 mg/dL — AB
Specific Gravity, Urine: 1.02 (ref 1.005–1.030)
pH: 6.5 (ref 5.0–8.0)

## 2023-08-20 LAB — URINALYSIS, MICROSCOPIC (REFLEX)

## 2023-08-20 LAB — LACTIC ACID, PLASMA: Lactic Acid, Venous: 0.7 mmol/L (ref 0.5–1.9)

## 2023-08-20 LAB — HCG, SERUM, QUALITATIVE: Preg, Serum: NEGATIVE

## 2023-08-20 MED ORDER — IOHEXOL 300 MG/ML  SOLN
100.0000 mL | Freq: Once | INTRAMUSCULAR | Status: AC | PRN
  Administered 2023-08-20: 100 mL via INTRAVENOUS

## 2023-08-20 MED ORDER — AMOXICILLIN-POT CLAVULANATE 875-125 MG PO TABS
1.0000 | ORAL_TABLET | Freq: Two times a day (BID) | ORAL | 0 refills | Status: DC
Start: 2023-08-20 — End: 2023-08-23

## 2023-08-20 MED ORDER — SODIUM CHLORIDE 0.9 % IV SOLN
100.0000 mg | Freq: Once | INTRAVENOUS | Status: AC
  Administered 2023-08-20: 100 mg via INTRAVENOUS
  Filled 2023-08-20: qty 100

## 2023-08-20 MED ORDER — SODIUM CHLORIDE 0.9 % IV SOLN
1.0000 g | Freq: Once | INTRAVENOUS | Status: AC
  Administered 2023-08-20: 1 g via INTRAVENOUS
  Filled 2023-08-20: qty 10

## 2023-08-20 MED ORDER — IBUPROFEN 600 MG PO TABS
600.0000 mg | ORAL_TABLET | ORAL | Status: DC | PRN
  Administered 2023-08-21 (×2): 600 mg via ORAL
  Filled 2023-08-20 (×2): qty 1

## 2023-08-20 MED ORDER — SODIUM CHLORIDE 0.9 % IV BOLUS
1000.0000 mL | Freq: Once | INTRAVENOUS | Status: AC
  Administered 2023-08-20: 1000 mL via INTRAVENOUS

## 2023-08-20 MED ORDER — ONDANSETRON HCL 4 MG/2ML IJ SOLN
4.0000 mg | Freq: Once | INTRAMUSCULAR | Status: AC
  Administered 2023-08-20: 4 mg via INTRAVENOUS
  Filled 2023-08-20: qty 2

## 2023-08-20 MED ORDER — METRONIDAZOLE 500 MG/100ML IV SOLN
500.0000 mg | Freq: Once | INTRAVENOUS | Status: AC
  Administered 2023-08-20: 500 mg via INTRAVENOUS
  Filled 2023-08-20: qty 100

## 2023-08-20 MED ORDER — ACETAMINOPHEN 500 MG PO TABS
1000.0000 mg | ORAL_TABLET | Freq: Once | ORAL | Status: AC
  Administered 2023-08-20: 1000 mg via ORAL
  Filled 2023-08-20: qty 2

## 2023-08-20 NOTE — ED Triage Notes (Signed)
 Pt reports RLQ pain since Thurs, worsening today; nausea and diarrhea;

## 2023-08-20 NOTE — ED Notes (Signed)
 Gave zofran  1933- Effective

## 2023-08-20 NOTE — Discharge Instructions (Addendum)
 Start the antibiotics as prescribed.  You can continue Tylenol  for pain.  Recommend if this pain worsens or you start developing high fevers, vomiting you will need to go straight to the ER.

## 2023-08-20 NOTE — ED Triage Notes (Signed)
 I think I'm having a diverticulitis flare up. Ate spicey food at beach. Started to not feel well on Thursday. Diarrhea, nausea, decreased appetite. Patient states pain initially on left side of abd. Also having irregular menstrual cycles and states cramps with those are more intense so unsure if all of her pain is due to her diverticulitis flare up or in part caused by menstrual cycle.

## 2023-08-20 NOTE — H&P (Signed)
 Jenny Daniel is an 48 y.o. female who presented tonight to [facility] ED with four days of abdominal pain. The pain started in the periumbilical region but is now localized over the right lower quadrant. She reports fever, nausea, and vomiting as well.   Pertinent Gynecological History: Menses: {menses:16152} Bleeding: {uterine bleeding:32112} Contraception: {contraception:5051} DES exposure: {denies/unknown:32108} Blood transfusions: {none:33079} Sexually transmitted diseases: {std risk:32110} Previous GYN Procedures: {previous procedures:3041388}  Last mammogram: {normal/abnormal***:32111} Date: *** Last pap: {normal/abnormal***:32111} Date: *** OB History: G***, P***   Menstrual History: Menarche age: *** Patient's last menstrual period was 08/19/2023.    Past Medical History:  Diagnosis Date   Right upper quadrant pain     Past Surgical History:  Procedure Laterality Date   CESAREAN SECTION     x2   CHOLECYSTECTOMY     pilonidal cyst removal      Family History  Problem Relation Age of Onset   Diabetes Mellitus II Other    Hyperlipidemia Other    Hypertension Other     Social History:  reports that she has never smoked. She has never used smokeless tobacco. She reports that she does not currently use alcohol. She reports that she does not use drugs.  Allergies: No Known Allergies  (Not in a hospital admission)   Review of Systems  Blood pressure (!) 105/52, pulse 98, temperature (!) 100.8 F (38.2 C), resp. rate 18, height 5' 1 (1.549 m), last menstrual period 08/19/2023, SpO2 96%. Physical Exam  Results for orders placed or performed during the hospital encounter of 08/20/23 (from the past 24 hours)  Comprehensive metabolic panel     Status: Abnormal   Collection Time: 08/20/23  7:34 PM  Result Value Ref Range   Sodium 137 135 - 145 mmol/L   Potassium 3.6 3.5 - 5.1 mmol/L   Chloride 104 98 - 111 mmol/L   CO2 20 (L) 22 - 32 mmol/L    Glucose, Bld 111 (H) 70 - 99 mg/dL   BUN 8 6 - 20 mg/dL   Creatinine, Ser 9.15 0.44 - 1.00 mg/dL   Calcium 9.0 8.9 - 89.6 mg/dL   Total Protein 7.4 6.5 - 8.1 g/dL   Albumin 3.7 3.5 - 5.0 g/dL   AST 22 15 - 41 U/L   ALT 19 0 - 44 U/L   Alkaline Phosphatase 76 38 - 126 U/L   Total Bilirubin 0.4 0.0 - 1.2 mg/dL   GFR, Estimated >39 >39 mL/min   Anion gap 14 5 - 15  CBC with Differential     Status: Abnormal   Collection Time: 08/20/23  7:34 PM  Result Value Ref Range   WBC 13.4 (H) 4.0 - 10.5 K/uL   RBC 4.15 3.87 - 5.11 MIL/uL   Hemoglobin 12.5 12.0 - 15.0 g/dL   HCT 63.0 63.9 - 53.9 %   MCV 88.9 80.0 - 100.0 fL   MCH 30.1 26.0 - 34.0 pg   MCHC 33.9 30.0 - 36.0 g/dL   RDW 86.7 88.4 - 84.4 %   Platelets 247 150 - 400 K/uL   nRBC 0.0 0.0 - 0.2 %   Neutrophils Relative % 85 %   Neutro Abs 11.2 (H) 1.7 - 7.7 K/uL   Lymphocytes Relative 7 %   Lymphs Abs 1.0 0.7 - 4.0 K/uL   Monocytes Relative 8 %   Monocytes Absolute 1.1 (H) 0.1 - 1.0 K/uL   Eosinophils Relative 0 %   Eosinophils Absolute 0.0 0.0 - 0.5  K/uL   Basophils Relative 0 %   Basophils Absolute 0.0 0.0 - 0.1 K/uL   Immature Granulocytes 0 %   Abs Immature Granulocytes 0.06 0.00 - 0.07 K/uL  Urinalysis, Routine w reflex microscopic -Urine, Clean Catch     Status: Abnormal   Collection Time: 08/20/23  7:34 PM  Result Value Ref Range   Color, Urine YELLOW YELLOW   APPearance CLEAR CLEAR   Specific Gravity, Urine 1.020 1.005 - 1.030   pH 6.5 5.0 - 8.0   Glucose, UA NEGATIVE NEGATIVE mg/dL   Hgb urine dipstick LARGE (A) NEGATIVE   Bilirubin Urine SMALL (A) NEGATIVE   Ketones, ur 80 (A) NEGATIVE mg/dL   Protein, ur 899 (A) NEGATIVE mg/dL   Nitrite NEGATIVE NEGATIVE   Leukocytes,Ua NEGATIVE NEGATIVE  hCG, serum, qualitative     Status: None   Collection Time: 08/20/23  7:34 PM  Result Value Ref Range   Preg, Serum NEGATIVE NEGATIVE  Urinalysis, Microscopic (reflex)     Status: Abnormal   Collection Time: 08/20/23   7:34 PM  Result Value Ref Range   RBC / HPF 21-50 0 - 5 RBC/hpf   WBC, UA 0-5 0 - 5 WBC/hpf   Bacteria, UA FEW (A) NONE SEEN   Squamous Epithelial / HPF 0-5 0 - 5 /HPF   Mucus PRESENT     CT ABDOMEN PELVIS W CONTRAST Result Date: 08/20/2023 CLINICAL DATA:  RLQ abdominal pain ?appy vs dtic. Pt reports RLQ pain since Thurs, worsening today; nausea and diarrhea EXAM: CT ABDOMEN AND PELVIS WITH CONTRAST TECHNIQUE: Multidetector CT imaging of the abdomen and pelvis was performed using the standard protocol following bolus administration of intravenous contrast. RADIATION DOSE REDUCTION: This exam was performed according to the departmental dose-optimization program which includes automated exposure control, adjustment of the mA and/or kV according to patient size and/or use of iterative reconstruction technique. CONTRAST:  OMNIPAQUE  IOHEXOL  300 MG/ML  SOLN COMPARISON:  MRI abdomen 09/16/2019, CT angio chest 01/03/2010 FINDINGS: Lower chest: No acute abnormality. Hepatobiliary: No focal liver abnormality. Status post cholecystectomy. No biliary dilatation. Pancreas: Subcentimeter fat density lesion within the pancreatic body likely a lipoma versus intercalation of fat. Normal pancreatic contour. No surrounding inflammatory changes. No main pancreatic ductal dilatation. Spleen: Normal in size without focal abnormality. Adrenals/Urinary Tract: No adrenal nodule bilaterally. Bilateral kidneys enhance symmetrically. No hydronephrosis. No hydroureter. The urinary bladder is unremarkable. Stomach/Bowel: Stomach is within normal limits. No evidence of bowel wall thickening or dilatation. The appendix is not definitely identified with no inflammatory changes in the right lower quadrant to suggest acute appendicitis. Vascular/Lymphatic: No abdominal aorta or iliac aneurysm. Mild atherosclerotic plaque of the aorta and its branches. No abdominal, pelvic, or inguinal lymphadenopathy. Reproductive: Uterus is  unremarkable. Dilated fallopian tubes with complex fluid versus multiloculated cystic lesion of the right adnexal lesion measuring 8.8 x 3.8 cm. Dilated fallopian tubes with complex fluid versus multiloculated cystic lesion of the right adnexal lesion measuring 7.2 x 4.4 cm. Uterine intramural fibroid. Other: No intraperitoneal free fluid. No intraperitoneal free gas. No organized fluid collection. Musculoskeletal: No abdominal wall hernia or abnormality. No suspicious lytic or blastic osseous lesions. No acute displaced fracture. L5-S1 intra DB space vacuum phenomenon and posterior disc osteophyte complex formation. IMPRESSION: 1. Hemosalpinx/pyosalpinx versus multiloculated complex cystic lesion of the right adnexal lesion. Hemosalpinx/pyosalpinx versus multiloculated complex cystic lesion of the right adnexal lesion. Recommend pelvic ultrasound for further evaluation. 2. Intramural uterine fibroid. Electronically Signed   By: Morgane  Margarite M.D.   On: 08/20/2023 21:17    Assessment/Plan: ***  Darey Hershberger A Kallie Depolo 08/20/2023, 10:01 PM

## 2023-08-20 NOTE — ED Provider Notes (Signed)
 [facility] Provider Note   CSN: [id number] Arrival date & time: 08/20/23  1858     Patient presents with: Abdominal Pain   Jenny Daniel is a 48 y.o. female.  With a history of of diverticulitis who presents to the ED for abdominal pain.  Patient has experienced abdominal pain over the last 4 days.  Pain began a periumbilical region but is now localized over the right lower quadrant.  Associated nausea vomiting diarrhea and fevers.  Her menstrual period started yesterday.  No urinary symptoms.    Abdominal Pain      Prior to Admission medications   Medication Sig Start Date End Date Taking? Authorizing Provider  amoxicillin -clavulanate (AUGMENTIN ) 875-125 MG tablet Take 1 tablet by mouth every 12 (twelve) hours. 08/20/23   Adah Wilbert LABOR, FNP    Allergies: Patient has no known allergies.    Review of Systems  Gastrointestinal:  Positive for abdominal pain.    Updated Vital Signs BP (!) 105/52   Pulse 98   Temp (!) 100.8 F (38.2 C)   Resp 18   Ht 5' 1 (1.549 m)   LMP 08/19/2023   SpO2 96%   BMI 34.54 kg/m   Physical Exam Vitals and nursing note reviewed.  HENT:     Head: Normocephalic and atraumatic.  Eyes:     Pupils: Pupils are equal, round, and reactive to light.  Cardiovascular:     Rate and Rhythm: Normal rate and regular rhythm.  Pulmonary:     Effort: Pulmonary effort is normal.     Breath sounds: Normal breath sounds.  Abdominal:     Palpations: Abdomen is soft.     Tenderness: There is abdominal tenderness in the right lower quadrant. There is no guarding or rebound.  Skin:    General: Skin is warm and dry.  Neurological:     Mental Status: She is alert.  Psychiatric:        Mood and Affect: Mood normal.     (all labs ordered are listed, but only abnormal results are displayed) Labs Reviewed  COMPREHENSIVE METABOLIC PANEL WITH GFR - Abnormal; Notable for the following components:       Result Value   CO2 20 (*)    Glucose, Bld 111 (*)    All other components within normal limits  CBC WITH DIFFERENTIAL/PLATELET - Abnormal; Notable for the following components:   WBC 13.4 (*)    Neutro Abs 11.2 (*)    Monocytes Absolute 1.1 (*)    All other components within normal limits  URINALYSIS, ROUTINE W REFLEX MICROSCOPIC - Abnormal; Notable for the following components:   Hgb urine dipstick LARGE (*)    Bilirubin Urine SMALL (*)    Ketones, ur 80 (*)    Protein, ur 100 (*)    All other components within normal limits  URINALYSIS, MICROSCOPIC (REFLEX) - Abnormal; Notable for the following components:   Bacteria, UA FEW (*)    All other components within normal limits  HCG, SERUM, QUALITATIVE  LACTIC ACID, PLASMA  LACTIC ACID, PLASMA    EKG: None  Radiology: CT ABDOMEN PELVIS W CONTRAST Result Date: 08/20/2023 CLINICAL DATA:  RLQ abdominal pain ?appy vs dtic. Pt reports RLQ pain since Thurs, worsening today; nausea and diarrhea EXAM: CT ABDOMEN AND PELVIS WITH CONTRAST TECHNIQUE: Multidetector CT imaging of the abdomen and pelvis was performed using the standard protocol following bolus administration of intravenous contrast. RADIATION DOSE REDUCTION: This exam  was performed according to the departmental dose-optimization program which includes automated exposure control, adjustment of the mA and/or kV according to patient size and/or use of iterative reconstruction technique. CONTRAST:  OMNIPAQUE  IOHEXOL  300 MG/ML  SOLN COMPARISON:  MRI abdomen 09/16/2019, CT angio chest 01/03/2010 FINDINGS: Lower chest: No acute abnormality. Hepatobiliary: No focal liver abnormality. Status post cholecystectomy. No biliary dilatation. Pancreas: Subcentimeter fat density lesion within the pancreatic body likely a lipoma versus intercalation of fat. Normal pancreatic contour. No surrounding inflammatory changes. No main pancreatic ductal dilatation. Spleen: Normal in size without focal  abnormality. Adrenals/Urinary Tract: No adrenal nodule bilaterally. Bilateral kidneys enhance symmetrically. No hydronephrosis. No hydroureter. The urinary bladder is unremarkable. Stomach/Bowel: Stomach is within normal limits. No evidence of bowel wall thickening or dilatation. The appendix is not definitely identified with no inflammatory changes in the right lower quadrant to suggest acute appendicitis. Vascular/Lymphatic: No abdominal aorta or iliac aneurysm. Mild atherosclerotic plaque of the aorta and its branches. No abdominal, pelvic, or inguinal lymphadenopathy. Reproductive: Uterus is unremarkable. Dilated fallopian tubes with complex fluid versus multiloculated cystic lesion of the right adnexal lesion measuring 8.8 x 3.8 cm. Dilated fallopian tubes with complex fluid versus multiloculated cystic lesion of the right adnexal lesion measuring 7.2 x 4.4 cm. Uterine intramural fibroid. Other: No intraperitoneal free fluid. No intraperitoneal free gas. No organized fluid collection. Musculoskeletal: No abdominal wall hernia or abnormality. No suspicious lytic or blastic osseous lesions. No acute displaced fracture. L5-S1 intra DB space vacuum phenomenon and posterior disc osteophyte complex formation. IMPRESSION: 1. Hemosalpinx/pyosalpinx versus multiloculated complex cystic lesion of the right adnexal lesion. Hemosalpinx/pyosalpinx versus multiloculated complex cystic lesion of the right adnexal lesion. Recommend pelvic ultrasound for further evaluation. 2. Intramural uterine fibroid. Electronically Signed   By: Morgane  Naveau M.D.   On: 08/20/2023 21:17     Procedures   Medications Ordered in the ED  doxycycline  (VIBRAMYCIN ) 100 mg in sodium chloride  0.9 % 250 mL IVPB (100 mg Intravenous New Bag/Given 08/20/23 2319)  ibuprofen  (ADVIL ) tablet 600 mg (has no administration in time range)  sodium chloride  0.9 % bolus 1,000 mL (0 mLs Intravenous Stopped 08/20/23 2111)  ondansetron  (ZOFRAN ) injection 4  mg (4 mg Intravenous Given 08/20/23 1933)  acetaminophen  (TYLENOL ) tablet 1,000 mg (1,000 mg Oral Given 08/20/23 1926)  iohexol  (OMNIPAQUE ) 300 MG/ML solution 100 mL (100 mLs Intravenous Contrast Given 08/20/23 2104)  sodium chloride  0.9 % bolus 1,000 mL (0 mLs Intravenous Stopped 08/20/23 2312)  cefTRIAXone  (ROCEPHIN ) 1 g in sodium chloride  0.9 % 100 mL IVPB (0 g Intravenous Stopped 08/20/23 2312)  metroNIDAZOLE  (FLAGYL ) IVPB 500 mg (0 mg Intravenous Stopped 08/20/23 2312)    Clinical Course as of 08/20/23 2324  Sun Aug 20, 2023  2127 Laboratory workup notable for leukocytosis of 13.4 with neutrophilic predominance.  Pregnancy negative.  UA positive for red blood cells hemoglobin consistent with current menstrual period.CT abdomen pelvis shows no evidence of acute appendicitis or diverticulitis.  There is concern for hemosalpinx/pyosalpinx versus multiloculated complex cystic lesion of the right adnexa.  Will reach out to GYN to discuss further evaluation and management [MP]  2144 Patient is followed by Dr. Gretta with [facility] gynecology.  I discussed with Dr. Claire ([facility] on call) who will admit patient to [facility] for further management of potential tubo-ovarian abscess.  Will cover with ceftriaxone , Flagyl  and doxycycline  here and provide additional liter of IV fluids. [MP]    Clinical Course User Index [MP] Pamella Ozell LABOR, DO  Medical Decision Making 48 year old female with history as above presenting to the ED for 4 days of abdominal pain.  Febrile with temperature of 100.8 here.  Tachycardic.  Uncomfortable appearing.  Exam notable for right lower quad tenderness.  Differential diagnosis includes: Acute intra-abdominal infectious/inflammatory process such as appendicitis, diverticulitis, pancreatitis and cholecystitis Urinary tract infection Pregnancy Ectopic pregnancy Ovarian cyst Tubo-ovarian abscess Atypical presentation for pneumonia Viral  gastroenteritis  Will obtain laboratory workup including CBC with differential, metabolic panel, lipase and urinalysis along with CT abdomen pelvis once we have a negative pregnancy test  Amount and/or Complexity of Data Reviewed Labs: ordered. Radiology: ordered.  Risk OTC drugs. Prescription drug management. Decision regarding hospitalization.        Final diagnoses:  TOA (tubo-ovarian abscess)    ED Discharge Orders     None          Pamella Ozell LABOR, DO 08/20/23 2324

## 2023-08-20 NOTE — ED Notes (Signed)
 [facility] called for transport.

## 2023-08-21 ENCOUNTER — Inpatient Hospital Stay (HOSPITAL_COMMUNITY)

## 2023-08-21 ENCOUNTER — Other Ambulatory Visit: Payer: Self-pay

## 2023-08-21 DIAGNOSIS — Z833 Family history of diabetes mellitus: Secondary | ICD-10-CM | POA: Diagnosis not present

## 2023-08-21 DIAGNOSIS — Z8249 Family history of ischemic heart disease and other diseases of the circulatory system: Secondary | ICD-10-CM | POA: Diagnosis not present

## 2023-08-21 DIAGNOSIS — R109 Unspecified abdominal pain: Secondary | ICD-10-CM | POA: Diagnosis present

## 2023-08-21 DIAGNOSIS — R059 Cough, unspecified: Secondary | ICD-10-CM | POA: Diagnosis present

## 2023-08-21 DIAGNOSIS — N938 Other specified abnormal uterine and vaginal bleeding: Secondary | ICD-10-CM | POA: Diagnosis present

## 2023-08-21 DIAGNOSIS — N7093 Salpingitis and oophoritis, unspecified: Secondary | ICD-10-CM | POA: Diagnosis present

## 2023-08-21 LAB — LACTIC ACID, PLASMA: Lactic Acid, Venous: 0.9 mmol/L (ref 0.5–1.9)

## 2023-08-21 MED ORDER — DIPHENOXYLATE-ATROPINE 2.5-0.025 MG PO TABS
1.0000 | ORAL_TABLET | Freq: Four times a day (QID) | ORAL | Status: DC | PRN
  Administered 2023-08-21 – 2023-08-23 (×4): 1 via ORAL
  Filled 2023-08-21 (×4): qty 1

## 2023-08-21 MED ORDER — IBUPROFEN 600 MG PO TABS
600.0000 mg | ORAL_TABLET | Freq: Four times a day (QID) | ORAL | Status: DC
  Administered 2023-08-21 – 2023-08-23 (×3): 600 mg via ORAL
  Filled 2023-08-21 (×5): qty 1

## 2023-08-21 MED ORDER — OXYCODONE HCL 5 MG PO TABS: 5.0000 mg | ORAL_TABLET | ORAL | Status: DC | PRN

## 2023-08-21 MED ORDER — DM-GUAIFENESIN ER 30-600 MG PO TB12
1.0000 | ORAL_TABLET | Freq: Two times a day (BID) | ORAL | Status: DC
  Administered 2023-08-21: 1 via ORAL
  Filled 2023-08-21 (×7): qty 1

## 2023-08-21 MED ORDER — GUAIFENESIN-DM 100-10 MG/5ML PO SYRP
5.0000 mL | ORAL_SOLUTION | ORAL | Status: DC | PRN
  Administered 2023-08-21 – 2023-08-23 (×4): 5 mL via ORAL
  Filled 2023-08-21 (×6): qty 5

## 2023-08-21 MED ORDER — ONDANSETRON HCL 4 MG/2ML IJ SOLN: 4.0000 mg | Freq: Four times a day (QID) | INTRAMUSCULAR | Status: DC | PRN

## 2023-08-21 MED ORDER — ACETAMINOPHEN 500 MG PO TABS
1000.0000 mg | ORAL_TABLET | Freq: Four times a day (QID) | ORAL | Status: DC
  Administered 2023-08-21 (×2): 1000 mg via ORAL
  Filled 2023-08-21 (×3): qty 2

## 2023-08-21 MED ORDER — DOXYCYCLINE HYCLATE 100 MG PO TABS
100.0000 mg | ORAL_TABLET | Freq: Two times a day (BID) | ORAL | Status: DC
  Administered 2023-08-21 – 2023-08-23 (×5): 100 mg via ORAL
  Filled 2023-08-21 (×5): qty 1

## 2023-08-21 MED ORDER — METRONIDAZOLE 500 MG/100ML IV SOLN
500.0000 mg | Freq: Two times a day (BID) | INTRAVENOUS | Status: DC
  Administered 2023-08-21 – 2023-08-23 (×5): 500 mg via INTRAVENOUS
  Filled 2023-08-21 (×5): qty 100

## 2023-08-21 MED ORDER — SODIUM CHLORIDE 0.9 % IV SOLN
1.0000 g | INTRAVENOUS | Status: DC
  Administered 2023-08-21 – 2023-08-22 (×2): 1 g via INTRAVENOUS
  Filled 2023-08-21 (×3): qty 10

## 2023-08-21 MED ORDER — ONDANSETRON HCL 4 MG/2ML IJ SOLN
4.0000 mg | Freq: Once | INTRAMUSCULAR | Status: AC
  Administered 2023-08-21: 4 mg via INTRAVENOUS
  Filled 2023-08-21: qty 2

## 2023-08-21 MED ORDER — ONDANSETRON HCL 4 MG PO TABS
4.0000 mg | ORAL_TABLET | Freq: Four times a day (QID) | ORAL | Status: DC | PRN
  Administered 2023-08-22: 4 mg via ORAL
  Filled 2023-08-21: qty 1

## 2023-08-21 NOTE — ED Notes (Signed)
 [facility] in route. Report given to receiving nurse at [facility] on [facility].

## 2023-08-21 NOTE — Progress Notes (Signed)
 Patient seen and examined.  Reports she is feeling much improved this AM.  Was able to tolerate a meal  BP 104/64 (BP Location: Left Arm)   Pulse (!) 102   Temp 98.2 F (36.8 C) (Oral)   Resp 18   Ht 5' 1 (1.549 m)   LMP 08/19/2023   SpO2 98%   BMI 34.54 kg/m   NAD, not ill appearing Abdomen: soft, non-distended, non-tender Pelvic: deferred  A/P: 48 yo with suspected bilateral TOAs TOA: on ceftriaxone , doxycycline  and flagyl .  CT and pelvic US  reviewed by Dr. Karalee with Interventional radiology.  Could likely be accessed for percutaneous drainage, but would be challenging approach.  Given the size of the TOAs, in a hemodynamically stable, well appearing premenopausal patient, will first allow for conservative approach with IV antibiotic therapy and repeat imaging (CT pelvis with contrast) on Wednesday.  If patient becomes acutely ill / spikes temps through antibiotic therapy, would consider repeat imaging sooner for placement of drains.  Gc/ct swab pending Plan of care discussed with patient and her partner and all questions answered

## 2023-08-22 LAB — GC/CHLAMYDIA PROBE AMP (~~LOC~~) NOT AT ARMC
Chlamydia: NEGATIVE
Comment: NEGATIVE
Comment: NORMAL
Neisseria Gonorrhea: NEGATIVE

## 2023-08-22 LAB — CBC
HCT: 33.6 % — ABNORMAL LOW (ref 36.0–46.0)
Hemoglobin: 10.8 g/dL — ABNORMAL LOW (ref 12.0–15.0)
MCH: 29.8 pg (ref 26.0–34.0)
MCHC: 32.1 g/dL (ref 30.0–36.0)
MCV: 92.6 fL (ref 80.0–100.0)
Platelets: 239 K/uL (ref 150–400)
RBC: 3.63 MIL/uL — ABNORMAL LOW (ref 3.87–5.11)
RDW: 13.4 % (ref 11.5–15.5)
WBC: 8.8 K/uL (ref 4.0–10.5)
nRBC: 0 % (ref 0.0–0.2)

## 2023-08-22 MED ORDER — ACETAMINOPHEN 500 MG PO TABS: 1000.0000 mg | ORAL_TABLET | Freq: Four times a day (QID) | ORAL | Status: DC | PRN

## 2023-08-22 NOTE — Progress Notes (Signed)
 Reports feeling.  Some loose stools with the antibiotics, but resolved with immodium Was able to each lunch and dinner last night without any residual nausea.  Denies fevers / chills  BP 99/63 (BP Location: Left Arm)   Pulse 86   Temp 98 F (36.7 C) (Oral)   Resp 17   Ht 5' 1 (1.549 m)   LMP 08/19/2023   SpO2 98%   BMI 34.54 kg/m   NAD, not ill appearing Abdomen: soft, non-distended, non-tender Pelvic: deferred  A/P: 48 yo HD#3 with suspected bilateral TOAs TOA: on ceftriaxone , doxycycline  and flagyl .  CT and pelvic US  reviewed by Dr. Karalee with Interventional radiology.  Could likely be accessed for percutaneous drainage, but would be challenging approach.  Given the size of the TOAs, in a hemodynamically stable, well appearing premenopausal patient, will first allow for conservative approach with IV antibiotic therapy and repeat imaging (CT pelvis with contrast) tomorrow.  CBC today with resolved leukocytosis--WBC 8.8.  If patient becomes acutely ill / spikes temps through antibiotic therapy, would consider repeat imaging sooner for placement of drains.  Gc/ct swab negative

## 2023-08-23 ENCOUNTER — Inpatient Hospital Stay (HOSPITAL_COMMUNITY)

## 2023-08-23 LAB — BASIC METABOLIC PANEL WITH GFR
Anion gap: 8 (ref 5–15)
BUN: 5 mg/dL — ABNORMAL LOW (ref 6–20)
CO2: 25 mmol/L (ref 22–32)
Calcium: 8.9 mg/dL (ref 8.9–10.3)
Chloride: 107 mmol/L (ref 98–111)
Creatinine, Ser: 0.8 mg/dL (ref 0.44–1.00)
GFR, Estimated: >60 mL/min (ref >60)
Glucose, Bld: 104 mg/dL — ABNORMAL HIGH (ref 70–99)
Potassium: 3.7 mmol/L (ref 3.5–5.1)
Sodium: 140 mmol/L (ref 135–145)

## 2023-08-23 LAB — CBC
HCT: 34 % — ABNORMAL LOW (ref 36.0–46.0)
Hemoglobin: 10.9 g/dL — ABNORMAL LOW (ref 12.0–15.0)
MCH: 29.7 pg (ref 26.0–34.0)
MCHC: 32.1 g/dL (ref 30.0–36.0)
MCV: 92.6 fL (ref 80.0–100.0)
Platelets: 301 K/uL (ref 150–400)
RBC: 3.67 MIL/uL — ABNORMAL LOW (ref 3.87–5.11)
RDW: 13.3 % (ref 11.5–15.5)
WBC: 7.8 K/uL (ref 4.0–10.5)
nRBC: 0 % (ref 0.0–0.2)

## 2023-08-23 MED ORDER — DOXYCYCLINE HYCLATE 100 MG PO TABS: 100.0000 mg | ORAL_TABLET | Freq: Two times a day (BID) | ORAL | 0 refills | Status: AC

## 2023-08-23 MED ORDER — IOHEXOL 350 MG/ML SOLN
75.0000 mL | Freq: Once | INTRAVENOUS | Status: AC | PRN
  Administered 2023-08-23: 75 mL via INTRAVENOUS

## 2023-08-23 MED ORDER — METRONIDAZOLE 500 MG PO TABS
500.0000 mg | ORAL_TABLET | Freq: Two times a day (BID) | ORAL | 0 refills | Status: AC
Start: 2023-08-23 — End: 2023-09-06

## 2023-08-23 NOTE — Progress Notes (Addendum)
 Feeling well.  Has been afebrile since admission.  Did not require tylenol  yesterday and only took a single dose of ibuprofen  for mild abdominal cramping (which resolved).  No abnormal discharge  BP (!) 107/59 (BP Location: Left Arm)   Pulse 85   Temp 98.1 F (36.7 C) (Oral)   Resp 17   Ht 5' 1 (1.549 m)   LMP 08/19/2023   SpO2 98%   BMI 34.54 kg/m   NAD, not ill appearing Abdomen: soft, non-distended, non-tender Pelvic: deferred  A/P: 48 yo HD#4 with suspected bilateral TOAs TOA: on ceftriaxone , doxycycline  and flagyl .  CT and pelvic US  reviewed by Dr. Karalee with Interventional radiology on 7/22  Could likely be accessed for percutaneous drainage, but would be challenging approach.  Given the size of the TOAs, in a hemodynamically stable, well appearing premenopausal patient, will first allow for conservative approach with IV antibiotic therapy and repeat imaging (CT pelvis with contrast) today.  CBC today with resolved leukocytosis and continues to trend down --WBC 7.8.    Dispo pending CT results.  Clinically appears well enough to transition home on PO antibiotics.     Addendum: CT results with grossly stable fluid collections in bilateral adnexa.  Was previously measured as 6 cm in right adnexa and 6.7 cm in left adnexa on US  on 7/21   Discussed options with patient.   IR indicated previously willingness to place drains if her symptoms worsened or she failed therapy.  Could discuss again this option w IR.    However, at this time, I would not say that she has failed antibiotic therapy.  Typical criteria to fail conservative medical management include new / persistent fever or leukocytosis, worsening / persistent pain, enlarging pelvic mass or sepsis.  She has improved on all of the above accounts with exception of size of fluid collections which are stable.  We also discussed option of extended outpatient antibiotics with close monitoring of symptoms and follow up.  She has  previously expressed the desire to avoid drain placement if possible and would like discharge to home at this time as she is feeling well.  Plan flagyl  / doxycycline  BID x 14 days.  She will follow up in office next week, then plan repeat US  / visit in 2 weeks.  Will monitor closely her symptoms/ temperature and call if any seem to be worsening.

## 2023-08-23 NOTE — Discharge Summary (Signed)
 Physician Discharge Summary  Patient ID: Jenny Daniel MRN: [id number] DOB/AGE: 1975-03-04 48 y.o.  Admit date: 08/20/2023 Discharge date: 08/23/2023  Admission Diagnoses:  Discharge Diagnoses:  Principal Problem:   TOA (tubo-ovarian abscess) Active Problems:   Tubo-ovarian abscess   Discharged Condition: {condition:18240}  Hospital Course: ***  Consults: {consultation:18241}  Significant Diagnostic Studies: {diagnostics:18242}  Treatments: {Tx:18249}  Discharge Exam: Blood pressure (!) 106/53, pulse 84, temperature 97.8 F (36.6 C), temperature source Oral, resp. rate 18, height 5' 1 (1.549 m), last menstrual period 08/19/2023, SpO2 100%. {physical zkjf:6958869}  Disposition: Discharge disposition: 01-Home or Self Care       Discharge Instructions     Call MD for:   Complete by: As directed    Call MD for:  persistant nausea and vomiting   Complete by: As directed    Call MD for:  severe uncontrolled pain   Complete by: As directed    Call MD for:  temperature >100.4   Complete by: As directed    Diet - low sodium heart healthy   Complete by: As directed       Allergies as of 08/23/2023   No Known Allergies      Medication List     STOP taking these medications    amoxicillin -clavulanate 875-125 MG tablet Commonly known as: AUGMENTIN        TAKE these medications    doxycycline  100 MG tablet Commonly known as: VIBRA -TABS Take 1 tablet (100 mg total) by mouth 2 (two) times daily for 14 days.   metroNIDAZOLE  500 MG tablet Commonly known as: FLAGYL  Take 1 tablet (500 mg total) by mouth 2 (two) times daily for 14 days.        Follow-up Information     Ob/Gyn, Landy Stains Follow up in 1 week(s).   Why: 5-7 days with Dr. Claire 2 weeks with ultrasound with Dr. Gretta Pass information: [address] [place] [address] [place] [address] [phone]                 Signed: JOLENE GEFFEL Angelo Prindle 08/23/2023, 12:46  PM

## 2023-08-23 NOTE — ED Provider Notes (Signed)
 [facility]    CSN: [id number] Arrival date & time: 08/20/23  1133      History   Chief Complaint Chief Complaint  Patient presents with   Abdominal Pain    HPI Jenny Daniel is a 48 y.o. female.   48 year old female that presents with abdominal pain. Started in the LLQ and now in the RLQ. She has a hx of diverticulitis and feels similar. She also just started her menstrual cycle. Currently in perimenopause. Associated nausea vomiting diarrhea and fevers.  Her menstrual period started yesterday.  No urinary symptoms.   Abdominal Pain   Past Medical History:  Diagnosis Date   Right upper quadrant pain     Patient Active Problem List   Diagnosis Date Noted   Tubo-ovarian abscess 08/21/2023   TOA (tubo-ovarian abscess) 08/20/2023   Pulmonary embolus (HCC) 09/09/2019   Generalized abdominal pain 09/05/2019   Other fatigue 09/05/2019   Right upper quadrant abdominal pain 08/12/2019   Anxiety 08/12/2019    Past Surgical History:  Procedure Laterality Date   CESAREAN SECTION     x2   CHOLECYSTECTOMY     pilonidal cyst removal      OB History   No obstetric history on file.      Home Medications    Prior to Admission medications   Medication Sig Start Date End Date Taking? Authorizing Provider  amoxicillin -clavulanate (AUGMENTIN ) 875-125 MG tablet Take 1 tablet by mouth every 12 (twelve) hours. 08/20/23  Yes Jaun Galluzzo, Wilbert LABOR, FNP    Family History Family History  Problem Relation Age of Onset   Diabetes Mellitus II Other    Hyperlipidemia Other    Hypertension Other     Social History Social History   Tobacco Use   Smoking status: Never   Smokeless tobacco: Never  Vaping Use   Vaping status: Never Used  Substance Use Topics   Alcohol use: Not Currently   Drug use: Never     Allergies   Patient has no known allergies.   Review of Systems Review of Systems  Gastrointestinal:  Positive for abdominal pain.  See HPI  Physical  Exam Triage Vital Signs ED Triage Vitals  Encounter Vitals Group     BP 08/20/23 1156 119/77     Girls Systolic BP Percentile --      Girls Diastolic BP Percentile --      Boys Systolic BP Percentile --      Boys Diastolic BP Percentile --      Pulse Rate 08/20/23 1156 (!) 120     Resp 08/20/23 1156 20     Temp 08/20/23 1156 99 F (37.2 C)     Temp Source 08/20/23 1156 Oral     SpO2 08/20/23 1156 99 %     Weight --      Height --      Head Circumference --      Peak Flow --      Pain Score 08/20/23 1158 4     Pain Loc --      Pain Education --      Exclude from Growth Chart --    No data found.  Updated Vital Signs BP 119/77 (BP Location: Right Arm)   Pulse (!) 120   Temp 99 F (37.2 C) (Oral)   Resp 20   LMP 08/19/2023   SpO2 99%   Visual Acuity Right Eye Distance:   Left Eye Distance:   Bilateral Distance:  Right Eye Near:   Left Eye Near:    Bilateral Near:     Physical Exam Vitals and nursing note reviewed.  Constitutional:      General: She is not in acute distress.    Appearance: She is well-developed. She is ill-appearing. She is not toxic-appearing.  Cardiovascular:     Rate and Rhythm: Tachycardia present.  Pulmonary:     Effort: Pulmonary effort is normal.  Abdominal:     General: Bowel sounds are normal.     Palpations: Abdomen is soft.     Tenderness: There is abdominal tenderness in the right lower quadrant, periumbilical area, suprapubic area and left lower quadrant.     Hernia: No hernia is present.  Skin:    General: Skin is warm and dry.  Neurological:     Mental Status: She is alert.  Psychiatric:        Mood and Affect: Mood normal.      UC Treatments / Results  Labs (all labs ordered are listed, but only abnormal results are displayed) Labs Reviewed - No data to display  EKG   Radiology No results found.  Procedures Procedures (including critical care time)  Medications Ordered in UC Medications - No data to  display  Initial Impression / Assessment and Plan / UC Course  I have reviewed the triage vital signs and the nursing notes.  Pertinent labs & imaging results that were available during my care of the patient were reviewed by me and considered in my medical decision making (see chart for details).     Lower abdominal pain-concern for diverticulitis or other underlying etiology.  Cannot rule out appendicitis at this time.  Patient is also currently on her menstrual cycle.  Unable to obtain urine due to gross blood from menstrual. Patient opted to go ahead and treat for potential diverticulitis at this time.  Treating with Augmentin .  Recommendations and strict ER precautions to go to the ER if symptoms worsen or do not improve.  Patient understanding and agree. She can take Tylenol  or Motrin  for the pain Final Clinical Impressions(s) / UC Diagnoses   Final diagnoses:  Lower abdominal pain     Discharge Instructions      Start the antibiotics as prescribed.  You can continue Tylenol  for pain.  Recommend if this pain worsens or you start developing high fevers, vomiting you will need to go straight to the ER.    ED Prescriptions     Medication Sig Dispense Auth. Provider   amoxicillin -clavulanate (AUGMENTIN ) 875-125 MG tablet Take 1 tablet by mouth every 12 (twelve) hours. 14 tablet Adah Wilbert LABOR, FNP      PDMP not reviewed this encounter.   Adah Wilbert LABOR, FNP 08/23/23 [phone]

## 2023-09-02 IMAGING — MG DIGITAL DIAGNOSTIC BILAT W/ TOMO W/ CAD
6 of 10 series · 6 of 30 positions shown · non-contrast
Comparison: Previous exam(s).

CLINICAL DATA: Follow-up for probably benign mass in the LEFT
breast which was identified on a baseline screening mammogram dated
03/15/2019.

EXAM:
DIGITAL DIAGNOSTIC BILATERAL MAMMOGRAM WITH TOMOSYNTHESIS AND CAD;
ULTRASOUND LEFT BREAST LIMITED
TECHNIQUE: Bilateral digital diagnostic mammography and breast tomosynthesis
was performed. The images were evaluated with computer-aided
detection.; Targeted ultrasound examination of the left breast was
performed.

[R MLO synth-2D]
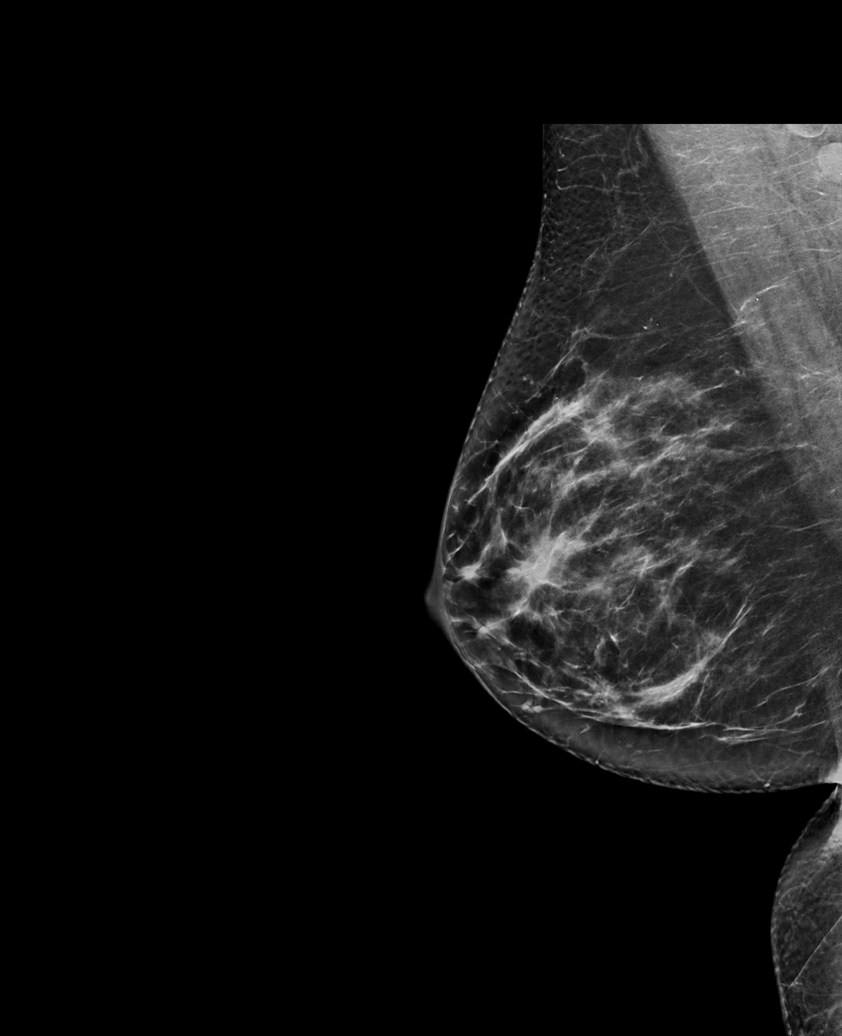

[L MLO synth-2D]
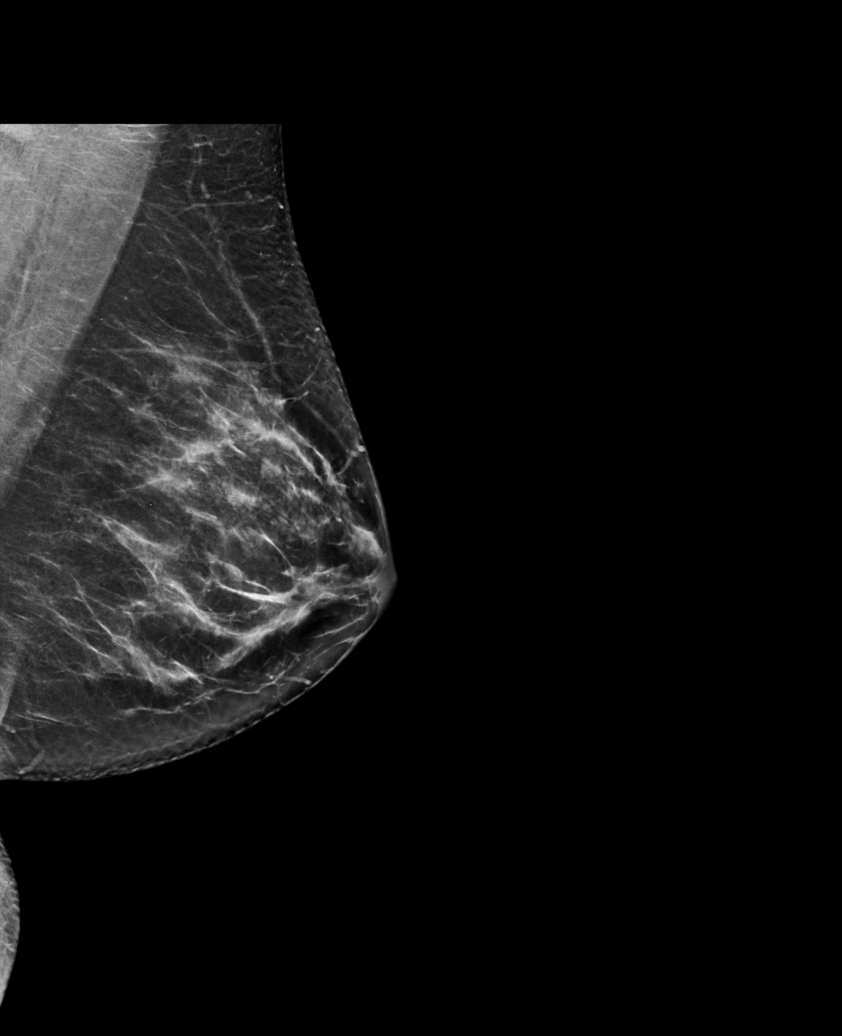

[L CC synth-2D (1 of 2)]
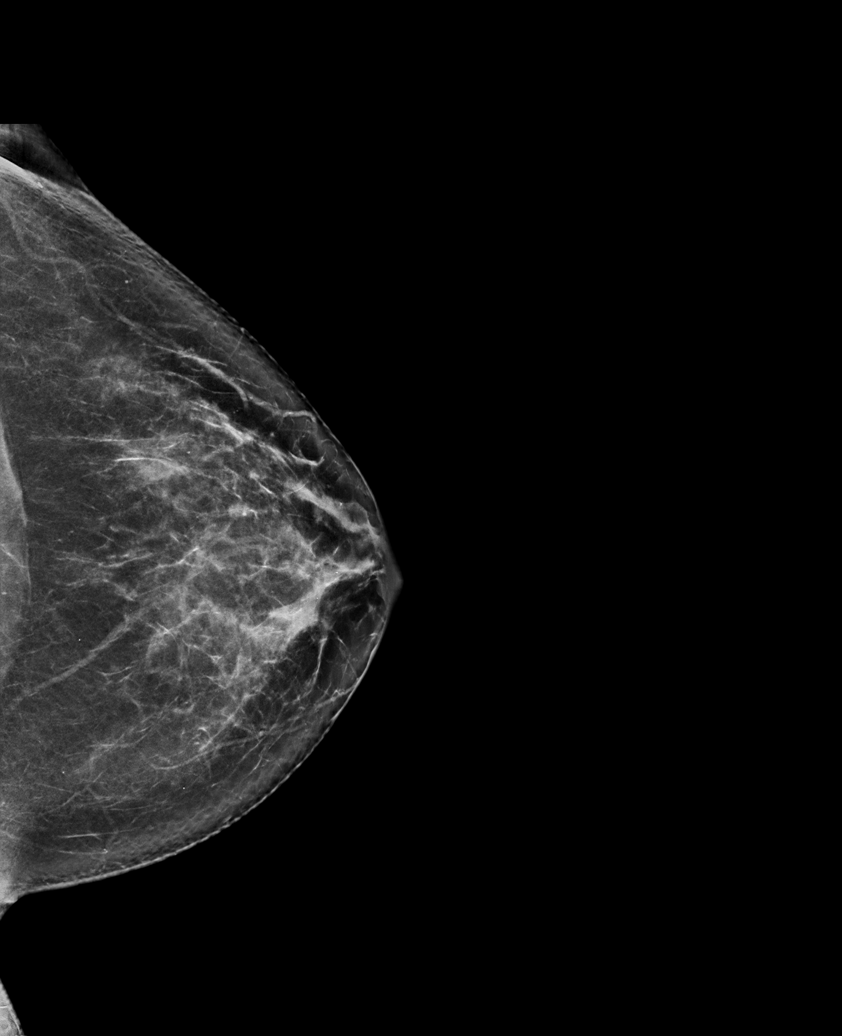

[R CC synth-2D]
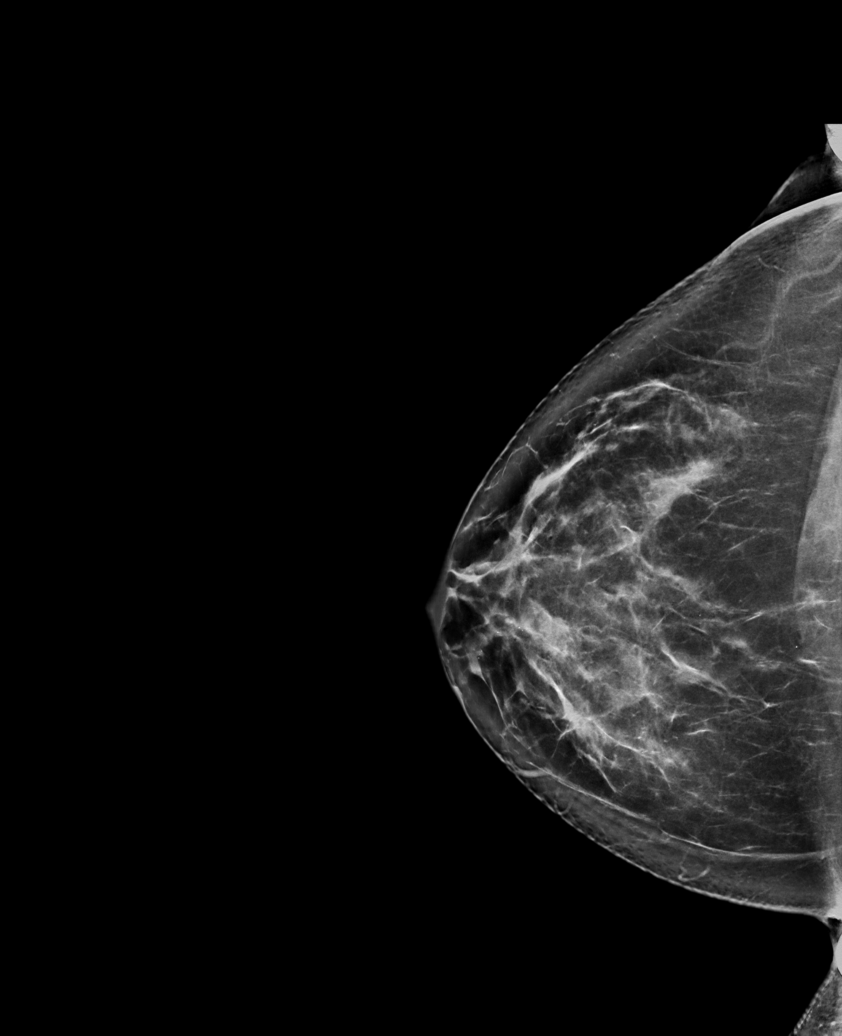

[L CC synth-2D (2 of 2)]
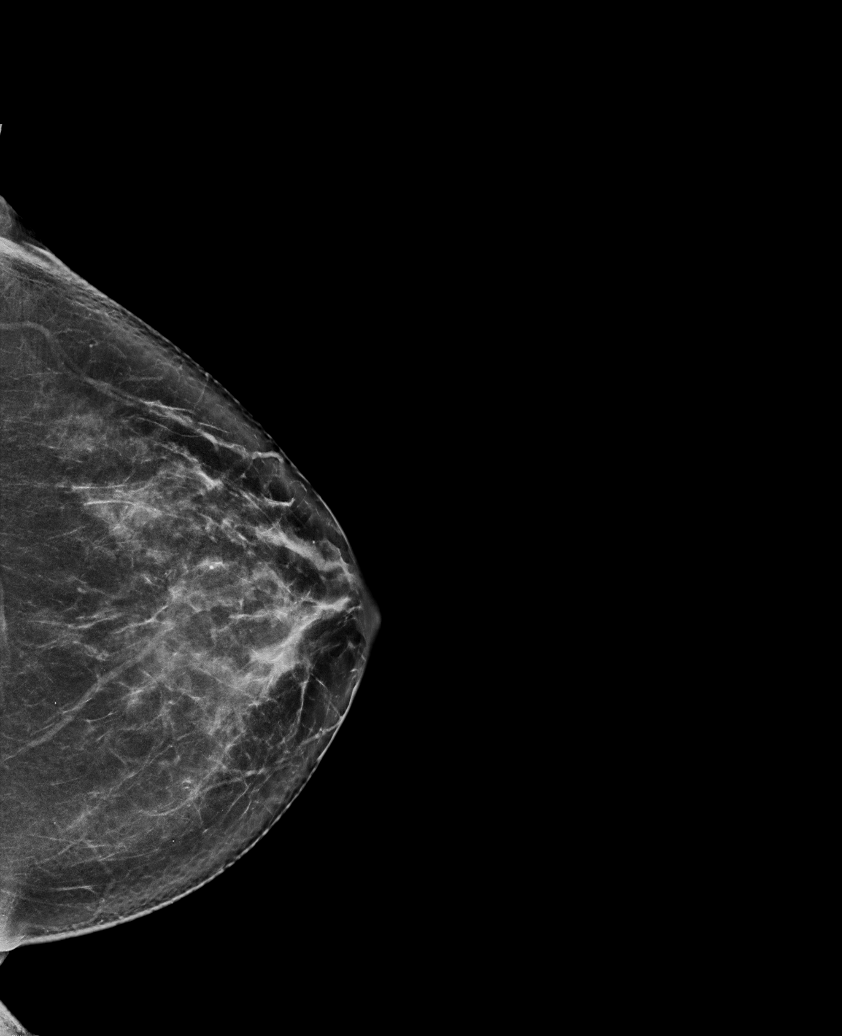

[L MLO tomo · tomo slice 41/80.0]
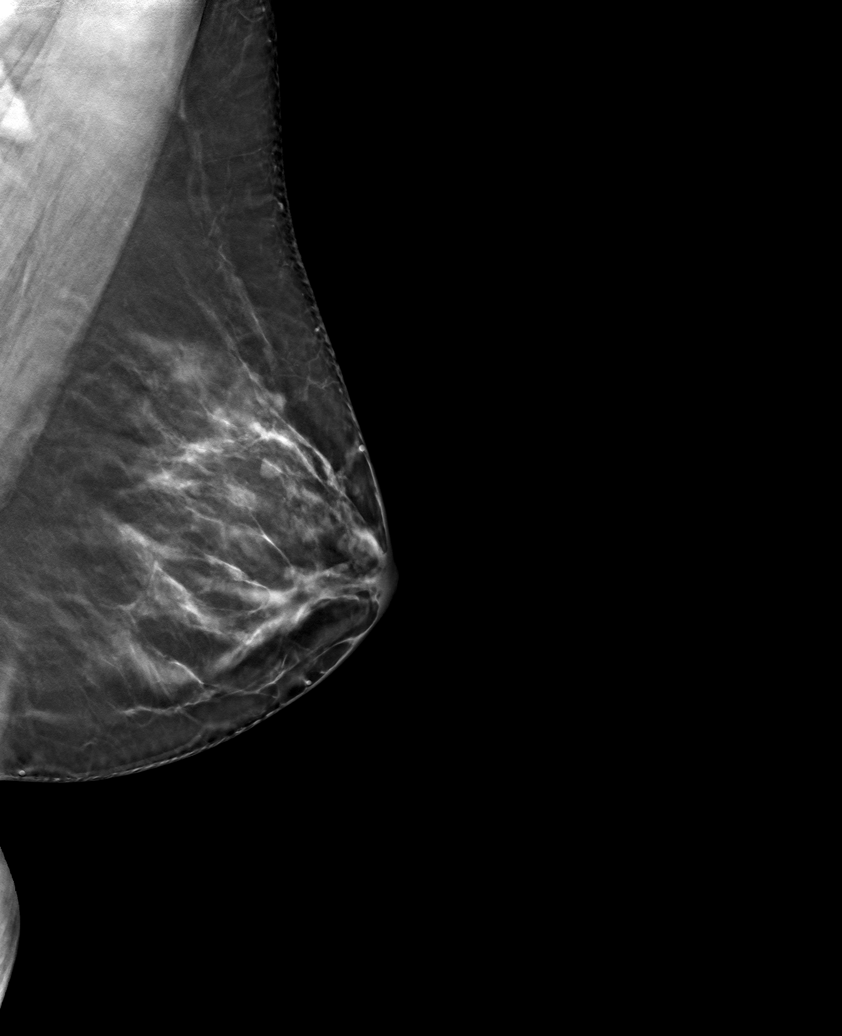

[6 of 30 positions shown; findings below may reference images not displayed]

ACR Breast Density Category c: The breast tissue is heterogeneously
dense, which may obscure small masses.
FINDINGS: The oval circumscribed mass within the LEFT breast is stable. There
are no new dominant masses, suspicious calcifications or secondary
signs of malignancy identified within either breast.

Targeted ultrasound is performed, again showing an oval
circumscribed hypoechoic mass in the LEFT breast at the 1 o'clock
axis, 2 cm from the nipple, measuring 5 mm, stable, now shown stable
for greater than 2 years confirming benignity.
IMPRESSION: 1. No evidence of malignancy within either breast.
2. Stable benign mass within the LEFT breast at the 1 o'clock axis,
now shown stable for greater than 2 years confirming benignity,
presumably a small benign fibroadenoma or complicated cyst. No
additional follow-up diagnostic imaging is necessary for this benign
finding.

Patient may return to routine annual bilateral screening mammogram
schedule.

RECOMMENDATION:
Screening mammogram in one year.(Code:G3-6-2QK)

I have discussed the findings and recommendations with the patient.
If applicable, a reminder letter will be sent to the patient
regarding the next appointment.

BI-RADS CATEGORY  2: Benign.

## 2023-09-02 IMAGING — US US BREAST*L* LIMITED INC AXILLA
1 series · 5 of 5 positions shown · non-contrast
Comparison: Previous exam(s).

CLINICAL DATA: Follow-up for probably benign mass in the LEFT
breast which was identified on a baseline screening mammogram dated
03/15/2019.

EXAM:
DIGITAL DIAGNOSTIC BILATERAL MAMMOGRAM WITH TOMOSYNTHESIS AND CAD;
ULTRASOUND LEFT BREAST LIMITED
TECHNIQUE: Bilateral digital diagnostic mammography and breast tomosynthesis
was performed. The images were evaluated with computer-aided
detection.; Targeted ultrasound examination of the left breast was
performed.

[Series 1: us breast*left* limited inc axilla · 0.06mm/px · 5 of 5 slices shown]
[im 1/5]
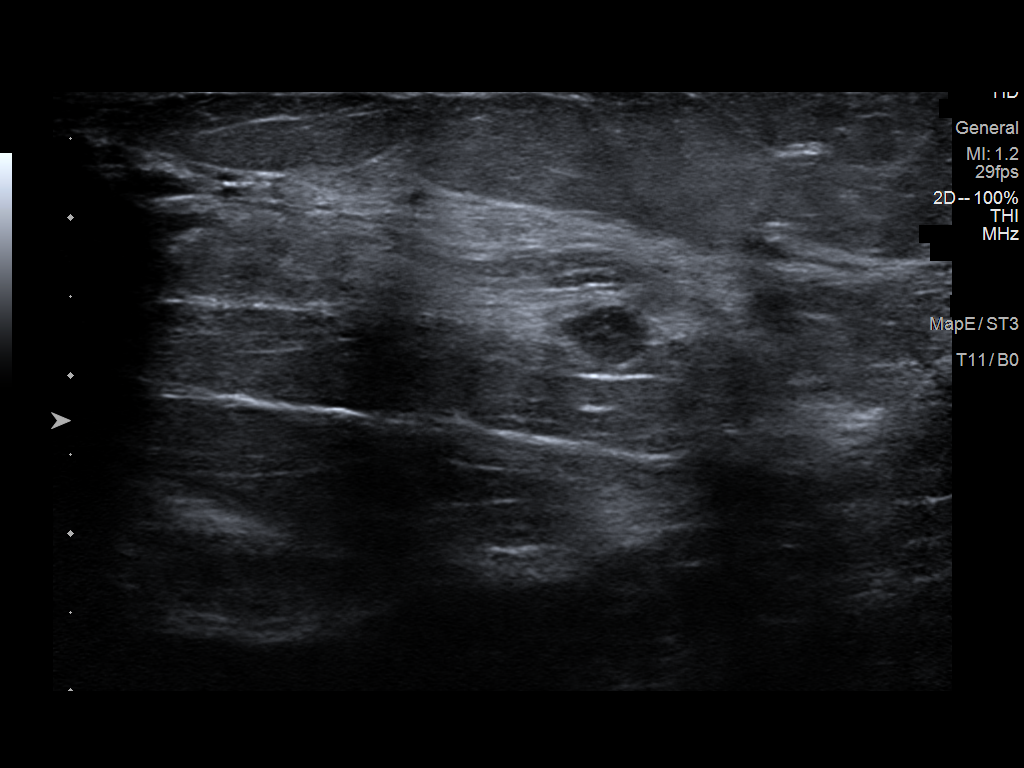
[im 2/5]
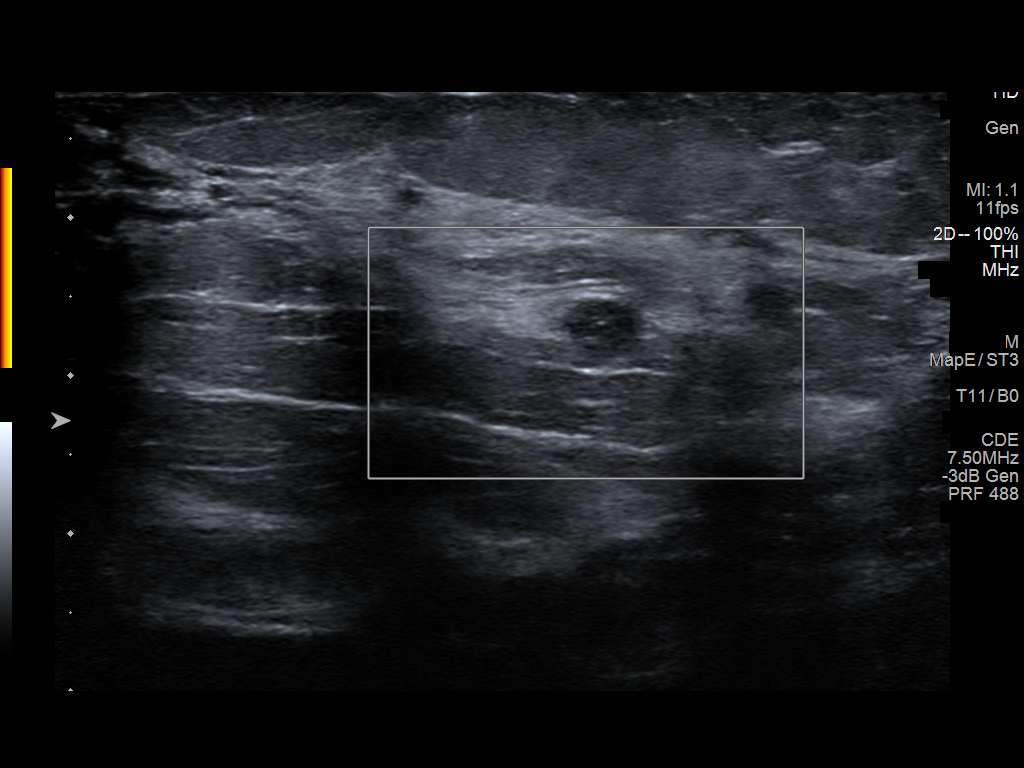
[im 3/5]
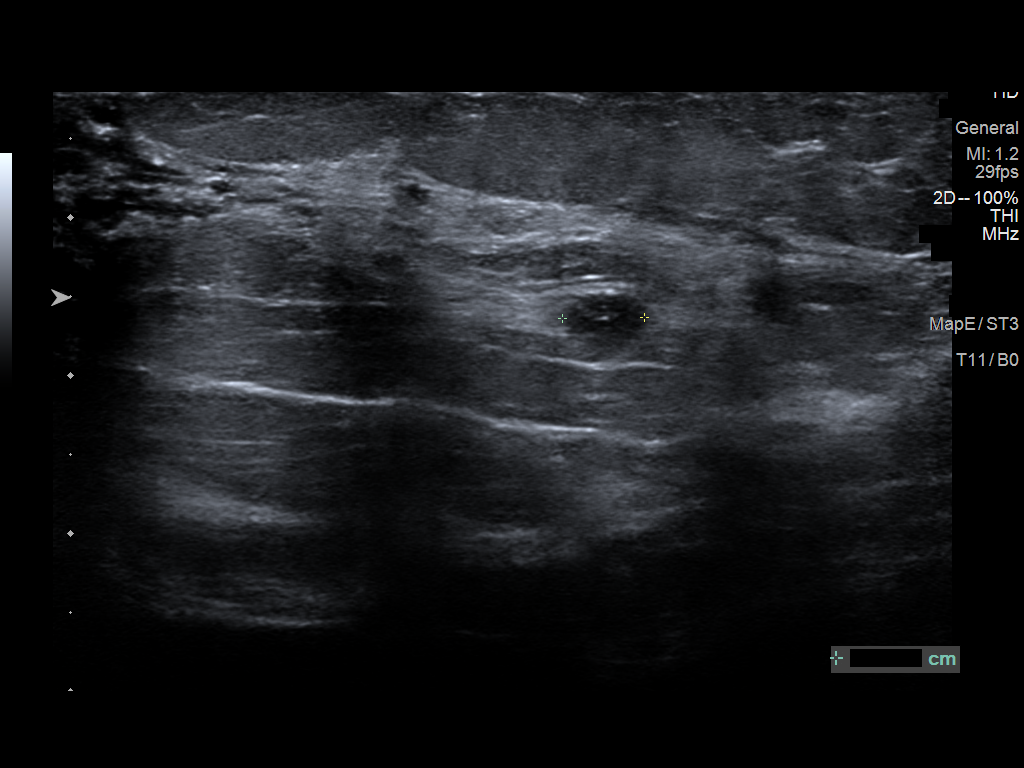
[im 4/5]
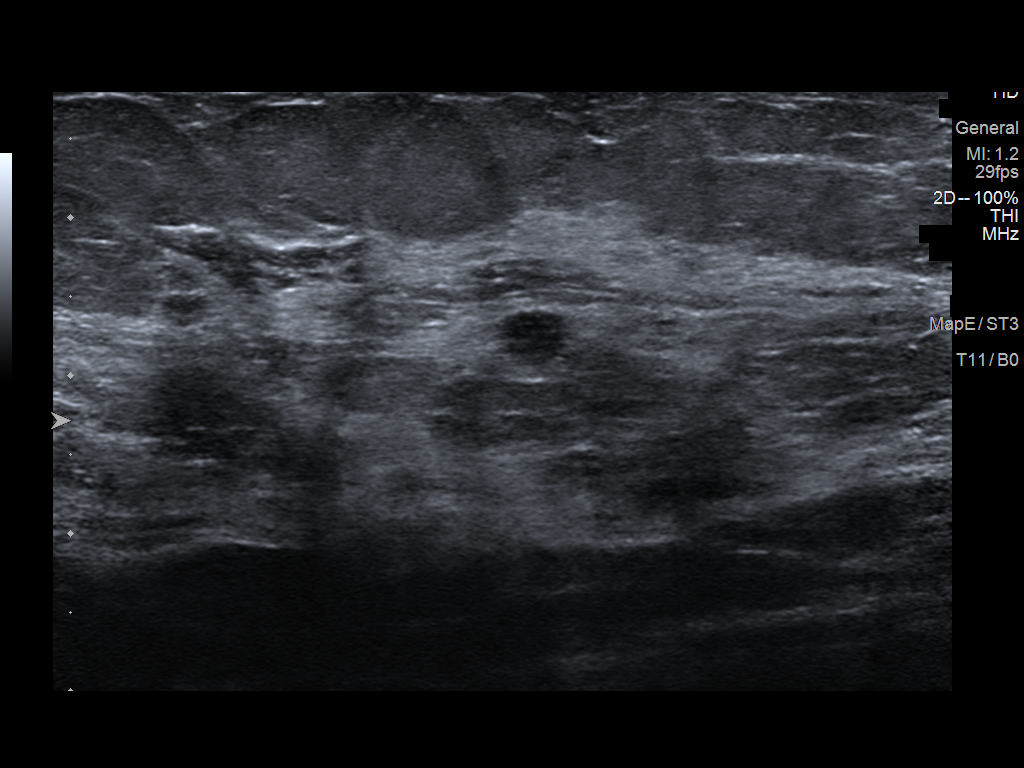
[im 5/5]
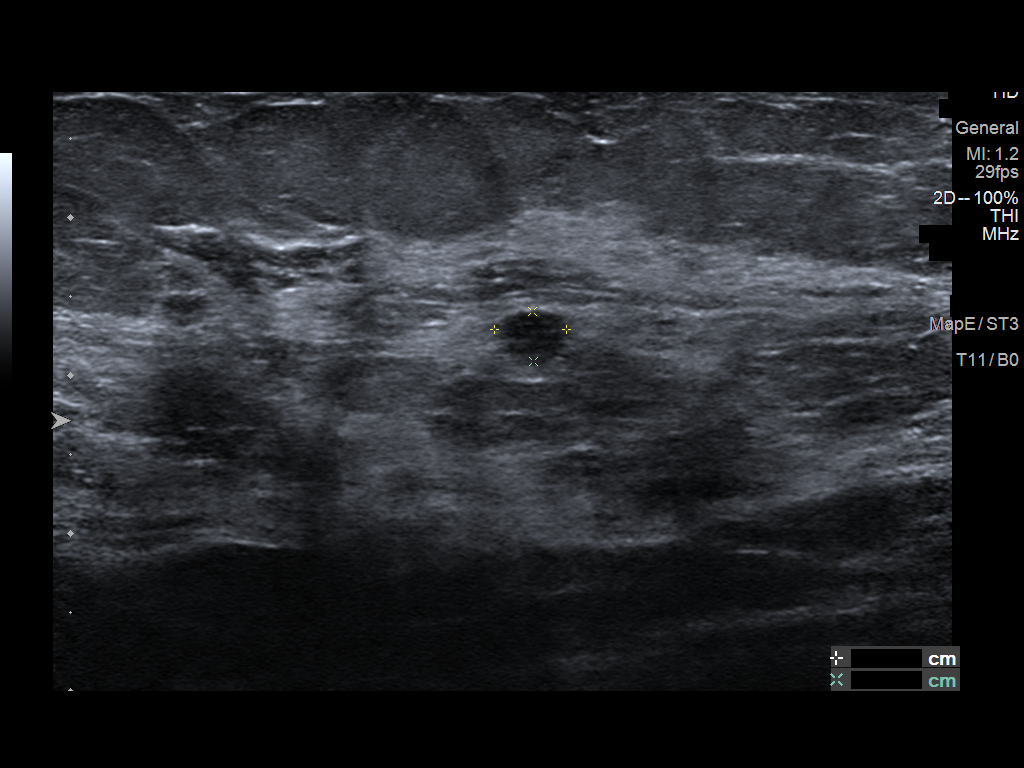

[5 of 5 positions shown; findings below may reference images not displayed]

ACR Breast Density Category c: The breast tissue is heterogeneously
dense, which may obscure small masses.
FINDINGS: The oval circumscribed mass within the LEFT breast is stable. There
are no new dominant masses, suspicious calcifications or secondary
signs of malignancy identified within either breast.

Targeted ultrasound is performed, again showing an oval
circumscribed hypoechoic mass in the LEFT breast at the 1 o'clock
axis, 2 cm from the nipple, measuring 5 mm, stable, now shown stable
for greater than 2 years confirming benignity.
IMPRESSION: 1. No evidence of malignancy within either breast.
2. Stable benign mass within the LEFT breast at the 1 o'clock axis,
now shown stable for greater than 2 years confirming benignity,
presumably a small benign fibroadenoma or complicated cyst. No
additional follow-up diagnostic imaging is necessary for this benign
finding.

Patient may return to routine annual bilateral screening mammogram
schedule.

RECOMMENDATION:
Screening mammogram in one year.(Code:G3-6-2QK)

I have discussed the findings and recommendations with the patient.
If applicable, a reminder letter will be sent to the patient
regarding the next appointment.

BI-RADS CATEGORY  2: Benign.
